# Patient Record
Sex: Female | Born: 1975 | Race: White | Hispanic: No | Marital: Married | Smoking: Never smoker
Health system: Southern US, Community
[De-identification: ages and names within clinical notes are randomized; demographics above are authoritative.]

## PROBLEM LIST (undated history)

## (undated) DIAGNOSIS — R112 Nausea with vomiting, unspecified: Secondary | ICD-10-CM

## (undated) DIAGNOSIS — Z8489 Family history of other specified conditions: Secondary | ICD-10-CM

## (undated) DIAGNOSIS — R209 Unspecified disturbances of skin sensation: Secondary | ICD-10-CM

## (undated) DIAGNOSIS — F329 Major depressive disorder, single episode, unspecified: Secondary | ICD-10-CM

## (undated) DIAGNOSIS — Z9889 Other specified postprocedural states: Secondary | ICD-10-CM

## (undated) DIAGNOSIS — T7840XA Allergy, unspecified, initial encounter: Secondary | ICD-10-CM

## (undated) DIAGNOSIS — R51 Headache: Secondary | ICD-10-CM

## (undated) DIAGNOSIS — D649 Anemia, unspecified: Secondary | ICD-10-CM

## (undated) DIAGNOSIS — F32A Depression, unspecified: Secondary | ICD-10-CM

## (undated) DIAGNOSIS — R519 Headache, unspecified: Secondary | ICD-10-CM

## (undated) DIAGNOSIS — B019 Varicella without complication: Secondary | ICD-10-CM

## (undated) HISTORY — PX: MOUTH SURGERY: SHX715

## (undated) HISTORY — DX: Varicella without complication: B01.9

## (undated) HISTORY — DX: Depression, unspecified: F32.A

## (undated) HISTORY — PX: WISDOM TOOTH EXTRACTION: SHX21

## (undated) HISTORY — PX: BREAST BIOPSY: SHX20

## (undated) HISTORY — DX: Major depressive disorder, single episode, unspecified: F32.9

## (undated) HISTORY — DX: Allergy, unspecified, initial encounter: T78.40XA

---

## 2003-01-03 HISTORY — PX: LASER ABLATION OF THE CERVIX: SHX1949

## 2009-08-10 ENCOUNTER — Encounter: Admission: RE | Admit: 2009-08-10 | Discharge: 2009-08-10 | Payer: Self-pay | Admitting: Obstetrics and Gynecology

## 2010-07-28 ENCOUNTER — Encounter: Payer: Self-pay | Admitting: Family Medicine

## 2010-07-28 ENCOUNTER — Ambulatory Visit (INDEPENDENT_AMBULATORY_CARE_PROVIDER_SITE_OTHER): Payer: BC Managed Care – PPO | Admitting: Family Medicine

## 2010-07-28 DIAGNOSIS — N879 Dysplasia of cervix uteri, unspecified: Secondary | ICD-10-CM | POA: Insufficient documentation

## 2010-07-28 DIAGNOSIS — Z0000 Encounter for general adult medical examination without abnormal findings: Secondary | ICD-10-CM

## 2010-07-28 DIAGNOSIS — F329 Major depressive disorder, single episode, unspecified: Secondary | ICD-10-CM

## 2010-07-28 DIAGNOSIS — IMO0001 Reserved for inherently not codable concepts without codable children: Secondary | ICD-10-CM | POA: Insufficient documentation

## 2010-07-28 DIAGNOSIS — F339 Major depressive disorder, recurrent, unspecified: Secondary | ICD-10-CM | POA: Insufficient documentation

## 2010-07-28 DIAGNOSIS — R635 Abnormal weight gain: Secondary | ICD-10-CM

## 2010-07-28 DIAGNOSIS — E785 Hyperlipidemia, unspecified: Secondary | ICD-10-CM

## 2010-07-28 LAB — LIPID PANEL
Cholesterol: 247 mg/dL — ABNORMAL HIGH (ref 0–200)
Triglycerides: 98 mg/dL (ref 0.0–149.0)
VLDL: 19.6 mg/dL (ref 0.0–40.0)

## 2010-07-28 LAB — LDL CHOLESTEROL, DIRECT: Direct LDL: 152.8 mg/dL

## 2010-07-28 MED ORDER — VENLAFAXINE HCL ER 150 MG PO CP24: 150.0000 mg | ORAL_CAPSULE | Freq: Every day | ORAL | Status: DC

## 2010-07-28 MED ORDER — VENLAFAXINE HCL ER 75 MG PO CP24: 75.0000 mg | ORAL_CAPSULE | Freq: Every day | ORAL | Status: DC

## 2010-07-28 MED ORDER — VENLAFAXINE HCL ER 37.5 MG PO CP24: 37.5000 mg | ORAL_CAPSULE | Freq: Every day | ORAL | Status: DC

## 2010-07-28 NOTE — Progress Notes (Signed)
  Subjective:    Patient ID: Felicia Ortega, female    DOB: 1976/01/01, 35 y.o.   MRN: [id number]  HPI New patient to establish care. 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 She's had multiple recurrences. She took herself off medication several months ago has had progressive depression symptoms since then. She's been on multiple agents previously including Prozac and Luvox but Effexor seemed to work best. She was at a dosage of 225 mg daily. No history of suicidal ideation. Frequent crying spells. Also has some anxiety symptoms frequently. She denied side effects with Effexor. She has allergy to sulfa medications. Weight gain of about 10 pounds during past year.  No edema.  Is exercising regularly but difficulty losing weight.  Prior history of abnormal Pap smear with laser treatment secondary to dysplasia 2005. She sees gynecologist regularly. History of dysplastic nevi. Follow closely by dermatology  Recent weight gain issues. Did start regular exercise recently but has had difficulty losing weight. Occasional fatigue issues.  Family history significant for father with depression and history of Hodgkin's disease and prostate cancer. Mother had melanoma and hyperlipidemia.  Patient is single. She has a Scientist, water quality in [employer].  nonsmoker. No alcohol use.   Review of Systems  Constitutional: Positive for fatigue and unexpected weight change (weight gain). Negative for fever, activity change and appetite change.  Eyes: Negative for visual disturbance.  Respiratory: Negative for cough and shortness of breath.   Cardiovascular: Negative for chest pain and leg swelling.  Gastrointestinal: Negative for abdominal pain.  Musculoskeletal: Negative for arthralgias.  Neurological: Negative for dizziness and headaches.  Psychiatric/Behavioral: Positive for dysphoric mood. Negative for suicidal ideas and confusion.       Objective:   Physical  Exam  Constitutional: She is oriented to person, place, and time. She appears well-developed and well-nourished. No distress.  Neck: Neck supple. No thyromegaly present.  Cardiovascular: Normal rate, regular rhythm and normal heart sounds.   No murmur heard. Pulmonary/Chest: Effort normal and breath sounds normal. No respiratory distress. She has no wheezes. She has no rales.  Lymphadenopathy:    She has no cervical adenopathy.  Neurological: She is alert and oriented to person, place, and time. No cranial nerve deficit.  Psychiatric: She has a normal mood and affect. Her behavior is normal.          Assessment & Plan:  #1 Recurrent depression. By history she's had at least 3 episodes which dictates recommendation for chronic antidepressant. Get back on Effexor and build up to 150 mg daily and reassess one month after reaching that dosage. Check TSH and patient request lipid panel with prior history reported hyperlipidemia.  Pt given names of some local counselors. #2 history of dysplastic nevi -continue close follow up with dermatology. # 3 history of cervical dysplasia #4 Weight gain.  Check TSH as above.

## 2010-07-29 NOTE — Progress Notes (Signed)
Quick Note:  Pt informed ______ 

## 2010-08-01 ENCOUNTER — Telehealth: Payer: Self-pay | Admitting: Family Medicine

## 2010-08-01 NOTE — Telephone Encounter (Signed)
Pt called and is req lab results, especially cholesterol. Pls call.

## 2010-08-01 NOTE — Telephone Encounter (Signed)
I called only phone number on our records, it was a female voice and it sounded like "you have reached the [place] residence".  I mailed the labs to pt home address with a note to confirm correct phone number for pt.

## 2010-08-01 NOTE — Telephone Encounter (Signed)
Due to network difficulties our call could not be sent at this time

## 2010-08-02 ENCOUNTER — Telehealth: Payer: Self-pay | Admitting: Family Medicine

## 2010-08-02 NOTE — Telephone Encounter (Signed)
Pt was in on 07/28/10 for lab work and would like to be contacted about the results of her cholesterol she needs the information for insurance forms.

## 2010-08-03 NOTE — Telephone Encounter (Signed)
Copy of pt labs mailed to her home, message left on her VM

## 2010-08-10 ENCOUNTER — Ambulatory Visit: Payer: Self-pay | Admitting: Internal Medicine

## 2010-09-28 ENCOUNTER — Ambulatory Visit: Payer: BC Managed Care – PPO | Admitting: Family Medicine

## 2010-10-05 ENCOUNTER — Ambulatory Visit (INDEPENDENT_AMBULATORY_CARE_PROVIDER_SITE_OTHER): Payer: BC Managed Care – PPO | Admitting: Family Medicine

## 2010-10-05 ENCOUNTER — Encounter: Payer: Self-pay | Admitting: Family Medicine

## 2010-10-05 VITALS — BP 110/80 | Temp 97.8°F | Wt 134.0 lb

## 2010-10-05 DIAGNOSIS — F329 Major depressive disorder, single episode, unspecified: Secondary | ICD-10-CM

## 2010-10-05 DIAGNOSIS — F339 Major depressive disorder, recurrent, unspecified: Secondary | ICD-10-CM

## 2010-10-05 MED ORDER — VENLAFAXINE HCL ER 150 MG PO CP24: 150.0000 mg | ORAL_CAPSULE | Freq: Every day | ORAL | Status: DC

## 2010-10-05 NOTE — Progress Notes (Signed)
  Subjective:    Patient ID: Felicia Ortega, female    DOB: 1975-06-05, 35 y.o.   MRN: [id number]  HPI Followup depression. Significantly improved. Effexor XR 150 mg daily. No side effects. Noted improvement in mood after couple of days. She had been binge eating and has been controlled with medication. She has lost about 7 pounds and is very happy with that. Overall has much improved focus. No crying spells.  Sleep OK.  Recent lab work normal TSH. Hyperlipidemia but excellent HDL. No family history of premature CAD.   Review of Systems  Constitutional: Negative for appetite change and fatigue.  Respiratory: Negative for shortness of breath.   Cardiovascular: Negative for chest pain.  Psychiatric/Behavioral: Negative for confusion, dysphoric mood and agitation. The patient is not nervous/anxious.        Objective:   Physical Exam  Constitutional: She is oriented to person, place, and time. She appears well-developed and well-nourished.  Cardiovascular: Normal rate, regular rhythm and normal heart sounds.   Pulmonary/Chest: Effort normal and breath sounds normal. No respiratory distress. She has no wheezes. She has no rales.  Musculoskeletal: She exhibits no edema.  Neurological: She is alert and oriented to person, place, and time. No cranial nerve deficit.  Psychiatric: She has a normal mood and affect. Her behavior is normal.          Assessment & Plan:  Recurrent depression. Greatly improved on Effexor 150 mg. Refill medication for one year. Given multiple recurrences we've recommended indefinite treatment. Flu vaccine offered and patient declines. Complete physical next summer.

## 2011-04-06 ENCOUNTER — Ambulatory Visit (INDEPENDENT_AMBULATORY_CARE_PROVIDER_SITE_OTHER): Payer: BC Managed Care – PPO | Admitting: Licensed Clinical Social Worker

## 2011-04-06 DIAGNOSIS — F331 Major depressive disorder, recurrent, moderate: Secondary | ICD-10-CM

## 2011-04-21 ENCOUNTER — Ambulatory Visit: Payer: BC Managed Care – PPO | Admitting: Licensed Clinical Social Worker

## 2011-05-01 ENCOUNTER — Ambulatory Visit (INDEPENDENT_AMBULATORY_CARE_PROVIDER_SITE_OTHER): Payer: BC Managed Care – PPO | Admitting: Licensed Clinical Social Worker

## 2011-05-01 DIAGNOSIS — F331 Major depressive disorder, recurrent, moderate: Secondary | ICD-10-CM

## 2011-05-15 ENCOUNTER — Ambulatory Visit: Payer: BC Managed Care – PPO | Admitting: Licensed Clinical Social Worker

## 2011-05-22 ENCOUNTER — Ambulatory Visit: Payer: BC Managed Care – PPO | Admitting: Licensed Clinical Social Worker

## 2011-07-28 LAB — HM PAP SMEAR: HM Pap smear: NORMAL

## 2011-09-28 ENCOUNTER — Ambulatory Visit (INDEPENDENT_AMBULATORY_CARE_PROVIDER_SITE_OTHER): Payer: BC Managed Care – PPO | Admitting: Family Medicine

## 2011-09-28 VITALS — BP 94/74 | HR 100 | Temp 98.1°F | Resp 12 | Ht 68.5 in | Wt 129.0 lb

## 2011-09-28 DIAGNOSIS — D239 Other benign neoplasm of skin, unspecified: Secondary | ICD-10-CM

## 2011-09-28 DIAGNOSIS — Z23 Encounter for immunization: Secondary | ICD-10-CM

## 2011-09-28 DIAGNOSIS — D229 Melanocytic nevi, unspecified: Secondary | ICD-10-CM | POA: Insufficient documentation

## 2011-09-28 DIAGNOSIS — Z0000 Encounter for general adult medical examination without abnormal findings: Secondary | ICD-10-CM

## 2011-09-28 LAB — HEPATIC FUNCTION PANEL
AST: 18 U/L (ref 0–37)
Albumin: 3.7 g/dL (ref 3.5–5.2)
Alkaline Phosphatase: 53 U/L (ref 39–117)
Bilirubin, Direct: 0 mg/dL (ref 0.0–0.3)

## 2011-09-28 LAB — CBC WITH DIFFERENTIAL/PLATELET
Basophils Absolute: 0.1 10*3/uL (ref 0.0–0.1)
HCT: 36 % (ref 36.0–46.0)
Lymphocytes Relative: 22.7 % (ref 12.0–46.0)
Lymphs Abs: 1.2 10*3/uL (ref 0.7–4.0)
MCHC: 32.2 g/dL (ref 30.0–36.0)
Monocytes Absolute: 0.5 10*3/uL (ref 0.1–1.0)
Neutro Abs: 3.3 10*3/uL (ref 1.4–7.7)
Neutrophils Relative %: 62.8 % (ref 43.0–77.0)
Platelets: 596 10*3/uL — ABNORMAL HIGH (ref 150.0–400.0)
RBC: 4.11 Mil/uL (ref 3.87–5.11)
RDW: 15.7 % — ABNORMAL HIGH (ref 11.5–14.6)

## 2011-09-28 LAB — TSH: TSH: 2.58 u[IU]/mL (ref 0.35–5.50)

## 2011-09-28 LAB — LDL CHOLESTEROL, DIRECT: Direct LDL: 208.6 mg/dL

## 2011-09-28 LAB — BASIC METABOLIC PANEL: Potassium: 4.3 mEq/L (ref 3.5–5.1)

## 2011-09-28 LAB — LIPID PANEL: Total CHOL/HDL Ratio: 4

## 2011-09-28 MED ORDER — TETANUS-DIPHTH-ACELL PERTUSSIS 5-2.5-18.5 LF-MCG/0.5 IM SUSP: 0.5000 mL | Freq: Once | INTRAMUSCULAR | Status: DC

## 2011-09-28 NOTE — Progress Notes (Signed)
  Subjective:    Patient ID: Felicia Ortega, female    DOB: 08/19/1975, 36 y.o.   MRN: [id number]  HPI  Patient here for wellness visit. She sees gynecologist regularly. History of cervical dysplasia. She also has history of recurrent depression currently stable on Effexor 150 mg daily. She has had some counseling in the past. She's been on antidepressants for over 10 years. Last tetanus unknown. Has not yet received flu vaccine. She has appointment scheduled with gynecologist for Pap smears.  Nonsmoker. No consistent exercise. Family history significant for mother with melanoma history. Father had prostate cancer.  Past Medical History  Diagnosis Date  . Chicken pox   . Depression   . Allergy    Past Surgical History  Procedure Date  . Laser ablation of the cervix 2005    dysplasia    reports that she has never smoked. She does not have any smokeless tobacco history on file. Her alcohol and drug histories not on file. family history includes Alcohol abuse in her maternal grandfather and paternal grandfather; Cancer in her father, maternal aunt, and maternal grandmother; Hodgkin's lymphoma in her father; Hyperlipidemia in her father, maternal grandfather, maternal grandmother, mother, paternal grandfather, and paternal grandmother; Melanoma in her maternal aunt; and Mental illness in her father. Allergies  Allergen Reactions  . Sulfa Antibiotics     hives     Review of Systems  Constitutional: Negative for fever, activity change, appetite change, fatigue and unexpected weight change.  HENT: Negative for hearing loss, ear pain, sore throat and trouble swallowing.   Eyes: Negative for visual disturbance.  Respiratory: Negative for cough and shortness of breath.   Cardiovascular: Negative for chest pain and palpitations.  Gastrointestinal: Negative for abdominal pain, diarrhea, constipation and blood in stool.  Genitourinary: Negative for dysuria and hematuria.  Musculoskeletal:  Negative for myalgias, back pain and arthralgias.  Skin: Negative for rash.  Neurological: Negative for dizziness, syncope and headaches.  Hematological: Negative for adenopathy.  Psychiatric/Behavioral: Negative for confusion and dysphoric mood.       Objective:   Physical Exam  Constitutional: She is oriented to person, place, and time. She appears well-developed and well-nourished.  HENT:  Head: Normocephalic and atraumatic.  Eyes: EOM are normal. Pupils are equal, round, and reactive to light.  Neck: Normal range of motion. Neck supple. No thyromegaly present.  Cardiovascular: Normal rate, regular rhythm and normal heart sounds.   No murmur heard. Pulmonary/Chest: Breath sounds normal. No respiratory distress. She has no wheezes. She has no rales.  Abdominal: Soft. Bowel sounds are normal. She exhibits no distension and no mass. There is no tenderness. There is no rebound and no guarding.  Musculoskeletal: Normal range of motion. She exhibits no edema.  Lymphadenopathy:    She has no cervical adenopathy.  Neurological: She is alert and oriented to person, place, and time. She displays normal reflexes. No cranial nerve deficit.  Skin: No rash noted.       Multiple nevi.  Some have slightly atypical features  She has follow up with dermatologist in couple of weeks for skin exam.  Psychiatric: She has a normal mood and affect. Her behavior is normal. Judgment and thought content normal.          Assessment & Plan:  Health maintenance. Flu vaccine given. Tdap given. Continue regular GYN followup. Baseline labs. Continue regular skin checks every 6 months

## 2011-09-28 NOTE — Patient Instructions (Addendum)
Continue regular GYN followup. Establish consistent weightbearing aerobic exercise Tetanus shot is good for 10 years

## 2011-09-29 ENCOUNTER — Other Ambulatory Visit: Payer: Self-pay | Admitting: *Deleted

## 2011-09-29 DIAGNOSIS — E785 Hyperlipidemia, unspecified: Secondary | ICD-10-CM

## 2011-09-29 NOTE — Progress Notes (Signed)
Quick Note:  Pt informed labs will be sent to her home with instructions highlighted ______

## 2011-10-31 ENCOUNTER — Other Ambulatory Visit: Payer: Self-pay | Admitting: Family Medicine

## 2012-07-30 ENCOUNTER — Other Ambulatory Visit: Payer: Self-pay | Admitting: Family Medicine

## 2012-10-08 ENCOUNTER — Other Ambulatory Visit: Payer: Self-pay | Admitting: Family Medicine

## 2012-11-11 ENCOUNTER — Encounter: Payer: Self-pay | Admitting: Family Medicine

## 2012-11-11 ENCOUNTER — Ambulatory Visit (INDEPENDENT_AMBULATORY_CARE_PROVIDER_SITE_OTHER): Payer: BC Managed Care – PPO | Admitting: Family Medicine

## 2012-11-11 VITALS — BP 122/74 | HR 79 | Temp 98.0°F | Ht 68.5 in | Wt 129.0 lb

## 2012-11-11 DIAGNOSIS — Z23 Encounter for immunization: Secondary | ICD-10-CM

## 2012-11-11 DIAGNOSIS — Z0000 Encounter for general adult medical examination without abnormal findings: Secondary | ICD-10-CM

## 2012-11-11 LAB — BASIC METABOLIC PANEL
BUN: 13 mg/dL (ref 6–23)
Calcium: 9.4 mg/dL (ref 8.4–10.5)
Chloride: 104 mEq/L (ref 96–112)
Creatinine, Ser: 0.9 mg/dL (ref 0.4–1.2)
GFR: 77.91 mL/min (ref >60.00)
Glucose, Bld: 85 mg/dL (ref 70–99)
Sodium: 139 mEq/L (ref 135–145)

## 2012-11-11 LAB — CBC WITH DIFFERENTIAL/PLATELET
Eosinophils Absolute: 0.1 10*3/uL (ref 0.0–0.7)
Lymphocytes Relative: 30.1 % (ref 12.0–46.0)
Lymphs Abs: 1.5 10*3/uL (ref 0.7–4.0)
MCHC: 31.3 g/dL (ref 30.0–36.0)
Monocytes Relative: 10.9 % (ref 3.0–12.0)
RBC: 4.2 Mil/uL (ref 3.87–5.11)
RDW: 17.8 % — ABNORMAL HIGH (ref 11.5–14.6)

## 2012-11-11 LAB — TSH: TSH: 2.06 u[IU]/mL (ref 0.35–5.50)

## 2012-11-11 LAB — LIPID PANEL
Cholesterol: 215 mg/dL — ABNORMAL HIGH (ref 0–200)
Triglycerides: 52 mg/dL (ref 0.0–149.0)

## 2012-11-11 LAB — HEPATIC FUNCTION PANEL
Bilirubin, Direct: 0 mg/dL (ref 0.0–0.3)
Total Protein: 6.8 g/dL (ref 6.0–8.3)

## 2012-11-11 MED ORDER — VENLAFAXINE HCL ER 150 MG PO CP24: 150.0000 mg | ORAL_CAPSULE | Freq: Every day | ORAL | Status: DC

## 2012-11-11 NOTE — Progress Notes (Signed)
  Subjective:    Patient ID: Felicia Ortega, female    DOB: Sep 23, 1975, 37 y.o.   MRN: [id number]  HPI Patient seen for complete physical. She continues to see gynecologist and dermatologist regularly. History of dysplastic nevi. Positive family history of melanoma in mother. Depression which is treated with Effexor XR. Requesting refills. Depression is stable. Needs flu vaccine.  History of severe hyperlipidemia. Otherwise low risk for CAD or peripheral vascular disease. She has made some dietary changes since last year. No history of smoking. She walks for exercise.  Past Medical History  Diagnosis Date  . Chicken pox   . Depression   . Allergy    Past Surgical History  Procedure Laterality Date  . Laser ablation of the cervix  2005    dysplasia    reports that she has never smoked. She does not have any smokeless tobacco history on file. Her alcohol and drug histories are not on file. family history includes Alcohol abuse in her maternal grandfather and paternal grandfather; Cancer in her father, maternal aunt, and maternal grandmother; Hodgkin's lymphoma in her father; Hyperlipidemia in her father, maternal grandfather, maternal grandmother, mother, paternal grandfather, and paternal grandmother; Melanoma in her maternal aunt; Mental illness in her father. Allergies  Allergen Reactions  . Sulfa Antibiotics     hives      Review of Systems  Constitutional: Negative for fever, activity change, appetite change, fatigue and unexpected weight change.  HENT: Negative for ear pain, hearing loss, sore throat and trouble swallowing.   Eyes: Negative for visual disturbance.  Respiratory: Negative for cough and shortness of breath.   Cardiovascular: Negative for chest pain and palpitations.  Gastrointestinal: Negative for abdominal pain, diarrhea, constipation and blood in stool.  Genitourinary: Negative for dysuria and hematuria.  Musculoskeletal: Negative for arthralgias, back  pain and myalgias.  Skin: Negative for rash.  Neurological: Negative for dizziness, syncope and headaches.  Hematological: Negative for adenopathy.  Psychiatric/Behavioral: Negative for confusion and dysphoric mood.       Objective:   Physical Exam  Constitutional: She is oriented to person, place, and time. She appears well-developed and well-nourished.  HENT:  Head: Normocephalic and atraumatic.  Eyes: EOM are normal. Pupils are equal, round, and reactive to light.  Neck: Normal range of motion. Neck supple. No thyromegaly present.  Cardiovascular: Normal rate, regular rhythm and normal heart sounds.   No murmur heard. Pulmonary/Chest: Breath sounds normal. No respiratory distress. She has no wheezes. She has no rales.  Abdominal: Soft. Bowel sounds are normal. She exhibits no distension and no mass. There is no tenderness. There is no rebound and no guarding.  Genitourinary:  Per GYN  Musculoskeletal: Normal range of motion. She exhibits no edema.  Lymphadenopathy:    She has no cervical adenopathy.  Neurological: She is alert and oriented to person, place, and time. She displays normal reflexes. No cranial nerve deficit.  Skin: No rash noted.  Psychiatric: She has a normal mood and affect. Her behavior is normal. Judgment and thought content normal.          Assessment & Plan:  Complete physical. Obtain screening lab work. Refill Effexor for one year. Flu vaccine given. We discussed the importance of weight-bearing exercise and adequate calcium and vitamin D.

## 2012-11-11 NOTE — Addendum Note (Signed)
Addended by: Shelby Dubin E on: 11/11/2012 11:25 AM   Modules accepted: Orders

## 2012-11-11 NOTE — Progress Notes (Signed)
Pre visit review using our clinic review tool, if applicable. No additional management support is needed unless otherwise documented below in the visit note. 

## 2012-11-15 ENCOUNTER — Other Ambulatory Visit: Payer: Self-pay | Admitting: Family Medicine

## 2012-11-15 DIAGNOSIS — D582 Other hemoglobinopathies: Secondary | ICD-10-CM

## 2012-12-25 ENCOUNTER — Emergency Department (HOSPITAL_BASED_OUTPATIENT_CLINIC_OR_DEPARTMENT_OTHER)
Admission: EM | Admit: 2012-12-25 | Discharge: 2012-12-25 | Disposition: A | Discharge status: Home / Self Care | Payer: Worker's Compensation | Attending: Emergency Medicine | Admitting: Emergency Medicine

## 2012-12-25 ENCOUNTER — Encounter (HOSPITAL_BASED_OUTPATIENT_CLINIC_OR_DEPARTMENT_OTHER): Payer: Self-pay | Admitting: Emergency Medicine

## 2012-12-25 DIAGNOSIS — S0101XA Laceration without foreign body of scalp, initial encounter: Secondary | ICD-10-CM

## 2012-12-25 DIAGNOSIS — Z8619 Personal history of other infectious and parasitic diseases: Secondary | ICD-10-CM | POA: Insufficient documentation

## 2012-12-25 DIAGNOSIS — Z79899 Other long term (current) drug therapy: Secondary | ICD-10-CM | POA: Insufficient documentation

## 2012-12-25 DIAGNOSIS — S0100XA Unspecified open wound of scalp, initial encounter: Secondary | ICD-10-CM | POA: Insufficient documentation

## 2012-12-25 DIAGNOSIS — Y9289 Other specified places as the place of occurrence of the external cause: Secondary | ICD-10-CM | POA: Insufficient documentation

## 2012-12-25 DIAGNOSIS — Y990 Civilian activity done for income or pay: Secondary | ICD-10-CM | POA: Insufficient documentation

## 2012-12-25 DIAGNOSIS — W2209XA Striking against other stationary object, initial encounter: Secondary | ICD-10-CM | POA: Insufficient documentation

## 2012-12-25 DIAGNOSIS — F329 Major depressive disorder, single episode, unspecified: Secondary | ICD-10-CM | POA: Insufficient documentation

## 2012-12-25 DIAGNOSIS — F3289 Other specified depressive episodes: Secondary | ICD-10-CM | POA: Insufficient documentation

## 2012-12-25 NOTE — ED Notes (Signed)
Patient hit top of head on concrete beam while at work. Very small superficial laceration noted, no loc, no nausea

## 2012-12-25 NOTE — ED Provider Notes (Signed)
CSN: [id number]     Arrival date & time 12/25/12  1207 History   First MD Initiated Contact with Patient 12/25/12 1337     Chief Complaint  Patient presents with  . Head Injury   (Consider location/radiation/quality/duration/timing/severity/associated sxs/prior Treatment) Patient is a 37 y.o. female presenting with head injury. The history is provided by the patient. No language interpreter was used.  Head Injury Location:  Generalized Mechanism of injury: unable to specify   Pain details:    Quality:  Aching   Severity:  No pain   Timing:  Constant   Progression:  Unable to specify Chronicity:  New Relieved by:  Nothing Worsened by:  Nothing tried Ineffective treatments:  None tried Associated symptoms: no headaches and no loss of consciousness   Pt complains of laceration to scalp.   Past Medical History  Diagnosis Date  . Chicken pox   . Depression   . Allergy    Past Surgical History  Procedure Laterality Date  . Laser ablation of the cervix  2005    dysplasia   Family History  Problem Relation Age of Onset  . Hyperlipidemia Mother   . Cancer Father     prostate  . Mental illness Father     depression, anxiety  . Hyperlipidemia Father   . Hodgkin's lymphoma Father   . Cancer Maternal Aunt     breast  . Melanoma Maternal Aunt   . Cancer Maternal Grandmother     lung  . Hyperlipidemia Maternal Grandmother   . Alcohol abuse Maternal Grandfather   . Hyperlipidemia Maternal Grandfather   . Hyperlipidemia Paternal Grandmother   . Alcohol abuse Paternal Grandfather   . Hyperlipidemia Paternal Grandfather    History  Substance Use Topics  . Smoking status: Never Smoker   . Smokeless tobacco: Not on file  . Alcohol Use: Not on file   OB History   Grav Para Term Preterm Abortions TAB SAB Ect Mult Living                 Review of Systems  Skin: Positive for wound.  Neurological: Negative for loss of consciousness and headaches.  All other systems  reviewed and are negative.    Allergies  Sulfa antibiotics  Home Medications   Current Outpatient Rx  Name  Route  Sig  Dispense  Refill  . venlafaxine XR (EFFEXOR-XR) 150 MG 24 hr capsule   Oral   Take 1 capsule (150 mg total) by mouth daily with breakfast.   90 capsule   3    BP 120/65  Pulse 69  Temp(Src) 98.2 F (36.8 C) (Oral)  Resp 18  SpO2 100% Physical Exam  Nursing note and vitals reviewed. Constitutional: She appears well-developed and well-nourished.  HENT:  Head: Normocephalic and atraumatic.  1cm superficial abrasion scalp  Eyes: Pupils are equal, round, and reactive to light.  Neck: Normal range of motion.  Pulmonary/Chest: Effort normal and breath sounds normal.  Musculoskeletal: Normal range of motion.  Skin: Skin is warm.  Psychiatric: She has a normal mood and affect.    ED Course  Procedures (including critical care time) Labs Review Labs Reviewed - No data to display Imaging Review No results found.  EKG Interpretation   None       MDM   1. Laceration of scalp, initial encounter    Wound care  And tylenol    Elson Areas, PA-C 12/25/12 1409

## 2012-12-25 NOTE — ED Provider Notes (Signed)
Medical screening examination/treatment/procedure(s) were performed by non-physician practitioner and as supervising physician I was immediately available for consultation/collaboration.  EKG Interpretation   None        Renad Jenniges, MD 12/25/12 1538 

## 2013-06-01 ENCOUNTER — Encounter (HOSPITAL_BASED_OUTPATIENT_CLINIC_OR_DEPARTMENT_OTHER): Payer: Self-pay | Admitting: Emergency Medicine

## 2013-06-01 ENCOUNTER — Emergency Department (HOSPITAL_BASED_OUTPATIENT_CLINIC_OR_DEPARTMENT_OTHER)
Admission: EM | Admit: 2013-06-01 | Discharge: 2013-06-01 | Disposition: A | Discharge status: Home / Self Care | Payer: BC Managed Care – PPO | Attending: Emergency Medicine | Admitting: Emergency Medicine

## 2013-06-01 DIAGNOSIS — Z8619 Personal history of other infectious and parasitic diseases: Secondary | ICD-10-CM | POA: Insufficient documentation

## 2013-06-01 DIAGNOSIS — L259 Unspecified contact dermatitis, unspecified cause: Secondary | ICD-10-CM | POA: Insufficient documentation

## 2013-06-01 DIAGNOSIS — Z79899 Other long term (current) drug therapy: Secondary | ICD-10-CM | POA: Insufficient documentation

## 2013-06-01 DIAGNOSIS — F3289 Other specified depressive episodes: Secondary | ICD-10-CM | POA: Insufficient documentation

## 2013-06-01 DIAGNOSIS — F329 Major depressive disorder, single episode, unspecified: Secondary | ICD-10-CM | POA: Insufficient documentation

## 2013-06-01 MED ORDER — PREDNISONE 10 MG PO TABS: ORAL_TABLET | ORAL | Status: DC

## 2013-06-01 NOTE — ED Provider Notes (Signed)
Medical screening examination/treatment/procedure(s) were conducted as a shared visit with non-physician practitioner(s) and myself.  I personally evaluated the patient during the encounter.  Nonspecific rash that is itchy x1 week.  Denies new exposures. No oral or mucus membrane involvemtn Possible bug bites. Distribution not consistent with scabies.  EKG Interpretation None        Ezequiel Essex, MD 06/01/13 2318

## 2013-06-01 NOTE — Discharge Instructions (Signed)

## 2013-06-01 NOTE — ED Notes (Signed)
Has had a rash for appx a week, spreading. Arms, legs and abd. States that it is itching and denies any new products

## 2013-06-01 NOTE — ED Notes (Signed)
PA at bedside.

## 2013-06-01 NOTE — ED Notes (Signed)
MD at bedside. 

## 2013-06-01 NOTE — ED Provider Notes (Signed)
CSN: [id number]     Arrival date & time 06/01/13  1830 History   First MD Initiated Contact with Patient 06/01/13 1834     Chief Complaint  Patient presents with  . Rash     (Consider location/radiation/quality/duration/timing/severity/associated sxs/prior Treatment) Patient is a 38 y.o. female presenting with rash. The history is provided by the patient. No language interpreter was used.  Rash Location:  Full body Quality: redness   Severity:  Moderate Timing:  Constant Progression:  Worsening Chronicity:  New Relieved by:  Nothing   Past Medical History  Diagnosis Date  . Chicken pox   . Depression   . Allergy    Past Surgical History  Procedure Laterality Date  . Laser ablation of the cervix  2005    dysplasia   Family History  Problem Relation Age of Onset  . Hyperlipidemia Mother   . Cancer Father     prostate  . Mental illness Father     depression, anxiety  . Hyperlipidemia Father   . Hodgkin's lymphoma Father   . Cancer Maternal Aunt     breast  . Melanoma Maternal Aunt   . Cancer Maternal Grandmother     lung  . Hyperlipidemia Maternal Grandmother   . Alcohol abuse Maternal Grandfather   . Hyperlipidemia Maternal Grandfather   . Hyperlipidemia Paternal Grandmother   . Alcohol abuse Paternal Grandfather   . Hyperlipidemia Paternal Grandfather    History  Substance Use Topics  . Smoking status: Never Smoker   . Smokeless tobacco: Not on file  . Alcohol Use: Yes     Comment: ocassional   OB History   Grav Para Term Preterm Abortions TAB SAB Ect Mult Living                 Review of Systems  Skin: Positive for rash.  All other systems reviewed and are negative.     Allergies  Sulfa antibiotics  Home Medications   Prior to Admission medications   Medication Sig Start Date End Date Taking? Authorizing Provider  predniSONE (DELTASONE) 10 MG tablet 6,5,4,3,2,1 taper 06/01/13   Fransico Meadow, PA-C  venlafaxine XR (EFFEXOR-XR) 150 MG 24  hr capsule Take 1 capsule (150 mg total) by mouth daily with breakfast. 11/11/12   Eulas Post, MD   BP 109/90  Pulse 94  Temp(Src) 98.7 F (37.1 C) (Oral)  Resp 18  Ht 5\' 8"  (1.727 m)  Wt 130 lb (58.968 kg)  BMI 19.77 kg/m2  SpO2 100%  LMP 05/11/2013 Physical Exam  Nursing note and vitals reviewed. Constitutional: She is oriented to person, place, and time. She appears well-developed and well-nourished.  HENT:  Head: Normocephalic.  Eyes: EOM are normal.  Neck: Normal range of motion.  Cardiovascular: Normal rate.   Pulmonary/Chest: Effort normal.  Abdominal: She exhibits no distension.  Musculoskeletal: Normal range of motion.  Neurological: She is alert and oriented to person, place, and time.  Skin: Rash noted.  Erythema multiple small raised areas  Psychiatric: She has a normal mood and affect.    ED Course  Procedures (including critical care time) Labs Review Labs Reviewed - No data to display  Imaging Review No results found.   EKG Interpretation None      MDM   Final diagnoses:  Contact dermatitis    Prednisone taper 6 day   Fransico Meadow, PA-C 06/01/13 1920

## 2013-11-05 ENCOUNTER — Other Ambulatory Visit: Payer: Self-pay | Admitting: Family Medicine

## 2013-11-17 ENCOUNTER — Telehealth: Payer: Self-pay | Admitting: Family Medicine

## 2013-11-17 MED ORDER — VENLAFAXINE HCL ER 150 MG PO CP24: 150.0000 mg | ORAL_CAPSULE | Freq: Every day | ORAL | Status: DC

## 2013-11-17 NOTE — Telephone Encounter (Signed)
Pt has appt in dec for cpx. Pt needs refill on generic effexor 150 mg #90 w/refills call into [facility] [address]

## 2013-11-17 NOTE — Telephone Encounter (Signed)
Rx sent to pharmacy   

## 2013-12-17 ENCOUNTER — Encounter: Payer: Self-pay | Admitting: Family Medicine

## 2014-01-08 ENCOUNTER — Encounter: Payer: Self-pay | Admitting: Family Medicine

## 2014-01-16 ENCOUNTER — Ambulatory Visit (INDEPENDENT_AMBULATORY_CARE_PROVIDER_SITE_OTHER): Payer: BLUE CROSS/BLUE SHIELD | Admitting: Family Medicine

## 2014-01-16 ENCOUNTER — Encounter: Payer: Self-pay | Admitting: Family Medicine

## 2014-01-16 VITALS — BP 124/74 | HR 80 | Temp 97.7°F | Wt 128.0 lb

## 2014-01-16 DIAGNOSIS — D509 Iron deficiency anemia, unspecified: Secondary | ICD-10-CM

## 2014-01-16 DIAGNOSIS — Z0000 Encounter for general adult medical examination without abnormal findings: Secondary | ICD-10-CM

## 2014-01-16 LAB — CBC WITH DIFFERENTIAL/PLATELET
BASOS PCT: 1.7 % (ref 0.0–3.0)
Basophils Absolute: 0.1 10*3/uL (ref 0.0–0.1)
EOS PCT: 1.8 % (ref 0.0–5.0)
Eosinophils Absolute: 0.1 10*3/uL (ref 0.0–0.7)
HCT: 31.4 % — ABNORMAL LOW (ref 36.0–46.0)
Hemoglobin: 9.6 g/dL — ABNORMAL LOW (ref 12.0–15.0)
LYMPHS PCT: 24.1 % (ref 12.0–46.0)
Lymphs Abs: 1.3 10*3/uL (ref 0.7–4.0)
MCHC: 30.6 g/dL (ref 30.0–36.0)
MCV: 74.9 fl — AB (ref 78.0–100.0)
Monocytes Absolute: 0.4 10*3/uL (ref 0.1–1.0)
Monocytes Relative: 6.9 % (ref 3.0–12.0)
Neutro Abs: 3.6 10*3/uL (ref 1.4–7.7)
Neutrophils Relative %: 65.5 % (ref 43.0–77.0)
Platelets: 612 10*3/uL — ABNORMAL HIGH (ref 150.0–400.0)
RBC: 4.18 Mil/uL (ref 3.87–5.11)
RDW: 20.2 % — ABNORMAL HIGH (ref 11.5–15.5)
WBC: 5.5 10*3/uL (ref 4.0–10.5)

## 2014-01-16 LAB — BASIC METABOLIC PANEL
BUN: 20 mg/dL (ref 6–23)
CHLORIDE: 107 meq/L (ref 96–112)
CO2: 24 mEq/L (ref 19–32)
CREATININE: 1.03 mg/dL (ref 0.40–1.20)
Calcium: 9.7 mg/dL (ref 8.4–10.5)
GFR: 62.89 mL/min (ref >60.00)
Glucose, Bld: 79 mg/dL (ref 70–99)
POTASSIUM: 5.1 meq/L (ref 3.5–5.1)
SODIUM: 139 meq/L (ref 135–145)

## 2014-01-16 LAB — LIPID PANEL
Cholesterol: 242 mg/dL — ABNORMAL HIGH (ref 0–200)
HDL: 82 mg/dL (ref >39.00)
LDL Cholesterol: 146 mg/dL — ABNORMAL HIGH (ref 0–99)
NonHDL: 160
TRIGLYCERIDES: 69 mg/dL (ref 0.0–149.0)
Total CHOL/HDL Ratio: 3
VLDL: 13.8 mg/dL (ref 0.0–40.0)

## 2014-01-16 LAB — HEPATIC FUNCTION PANEL
ALK PHOS: 44 U/L (ref 39–117)
ALT: 9 U/L (ref 0–35)
AST: 17 U/L (ref 0–37)
Albumin: 4.1 g/dL (ref 3.5–5.2)
BILIRUBIN DIRECT: 0.1 mg/dL (ref 0.0–0.3)
TOTAL PROTEIN: 7.4 g/dL (ref 6.0–8.3)
Total Bilirubin: 0.3 mg/dL (ref 0.2–1.2)

## 2014-01-16 LAB — TSH: TSH: 2.5 u[IU]/mL (ref 0.35–4.50)

## 2014-01-16 LAB — FERRITIN: FERRITIN: 1.2 ng/mL — AB (ref 10.0–291.0)

## 2014-01-16 MED ORDER — VENLAFAXINE HCL ER 150 MG PO CP24: 150.0000 mg | ORAL_CAPSULE | Freq: Every day | ORAL | Status: DC

## 2014-01-16 NOTE — Progress Notes (Signed)
   Subjective:    Patient ID: Felicia Ortega, female    DOB: April 11, 1975, 39 y.o.   MRN: [id number]  HPI Patient seen for complete physical. She sees gynecologist and dermatologist regularly. She has a history of recurrent depression which is currently stable on Effexor 150 mg once daily. She had iron deficiency last year. She has heavy menses. She has discussed this with her gynecologist. They had her on iron supplement but she recently ran out. She does have some fatigue issues occasionally but no dizziness. Exercises somewhat sporadically. Tetanus up-to-date. Had flu vaccine. History of hyperlipidemia but low risk for CAD overall  Reviewed with no changes  Past Medical History  Diagnosis Date  . Chicken pox   . Depression   . Allergy    Past Surgical History  Procedure Laterality Date  . Laser ablation of the cervix  2005    dysplasia    reports that she has never smoked. She does not have any smokeless tobacco history on file. She reports that she drinks alcohol. She reports that she does not use illicit drugs. family history includes Alcohol abuse in her maternal grandfather and paternal grandfather; Cancer in her father, maternal aunt, and maternal grandmother; Hodgkin's lymphoma in her father; Hyperlipidemia in her father, maternal grandfather, maternal grandmother, mother, paternal grandfather, and paternal grandmother; Melanoma in her maternal aunt; Mental illness in her father. Allergies  Allergen Reactions  . Sulfa Antibiotics     hives      Review of Systems  Constitutional: Negative for fever, activity change, appetite change, fatigue and unexpected weight change.  HENT: Negative for ear pain, hearing loss, sore throat and trouble swallowing.   Eyes: Negative for visual disturbance.  Respiratory: Negative for cough and shortness of breath.   Cardiovascular: Negative for chest pain and palpitations.  Gastrointestinal: Negative for abdominal pain, diarrhea,  constipation and blood in stool.  Genitourinary: Negative for dysuria and hematuria.  Musculoskeletal: Negative for myalgias, back pain and arthralgias.  Skin: Negative for rash.  Neurological: Negative for dizziness, syncope and headaches.  Hematological: Negative for adenopathy.  Psychiatric/Behavioral: Negative for confusion and dysphoric mood.       Objective:   Physical Exam  Constitutional: She is oriented to person, place, and time. She appears well-developed and well-nourished.  HENT:  Head: Normocephalic and atraumatic.  Eyes: EOM are normal. Pupils are equal, round, and reactive to light.  Neck: Normal range of motion. Neck supple. No thyromegaly present.  Cardiovascular: Normal rate, regular rhythm and normal heart sounds.   No murmur heard. Pulmonary/Chest: Breath sounds normal. No respiratory distress. She has no wheezes. She has no rales.  Abdominal: Soft. Bowel sounds are normal. She exhibits no distension and no mass. There is no tenderness. There is no rebound and no guarding.  Genitourinary:  Per GYN  Musculoskeletal: Normal range of motion. She exhibits no edema.  Lymphadenopathy:    She has no cervical adenopathy.  Neurological: She is alert and oriented to person, place, and time. She displays normal reflexes. No cranial nerve deficit.  Skin: No rash noted.  Psychiatric: She has a normal mood and affect. Her behavior is normal. Judgment and thought content normal.          Assessment & Plan:  Complete physical. Obtain labs. Include ferritin. Continue iron supplementation. Handout on iron rich diet given. Refill Effexor for one year

## 2014-01-16 NOTE — Progress Notes (Signed)
Pre visit review using our clinic review tool, if applicable. No additional management support is needed unless otherwise documented below in the visit note. 

## 2014-01-16 NOTE — Patient Instructions (Signed)
Iron-Rich Diet An iron-rich diet contains foods that are good sources of iron. Iron is an important mineral that helps your body produce hemoglobin. Hemoglobin is a protein in red blood cells that carries oxygen to the body's tissues. Sometimes, the iron level in your blood can be low. This may be caused by:  A lack of iron in your diet.  Blood loss.  Times of growth, such as during pregnancy or during a child's growth and development. Low levels of iron can cause a decrease in the number of red blood cells. This can result in iron deficiency anemia. Iron deficiency anemia symptoms include:  Tiredness.  Weakness.  Irritability.  Increased chance of infection. Here are some recommendations for daily iron intake:  Males older than 41 years of age need 8 mg of iron per day.  Women ages 19 to 50 need 18 mg of iron per day.  Pregnant women need 27 mg of iron per day, and women who are over 19 years of age and breastfeeding need 9 mg of iron per day.  Women over the age of 50 need 8 mg of iron per day. SOURCES OF IRON There are 2 types of iron that are found in food: heme iron and nonheme iron. Heme iron is absorbed by the body better than nonheme iron. Heme iron is found in meat, poultry, and fish. Nonheme iron is found in grains, beans, and vegetables. Heme Iron Sources Food / Iron (mg)  Chicken liver, 3 oz (85 g)/ 10 mg  Beef liver, 3 oz (85 g)/ 5.5 mg  Oysters, 3 oz (85 g)/ 8 mg  Beef, 3 oz (85 g)/ 2 to 3 mg  Shrimp, 3 oz (85 g)/ 2.8 mg  Turkey, 3 oz (85 g)/ 2 mg  Chicken, 3 oz (85 g) / 1 mg  Fish (tuna, halibut), 3 oz (85 g)/ 1 mg  Pork, 3 oz (85 g)/ 0.9 mg Nonheme Iron Sources Food / Iron (mg)  Ready-to-eat breakfast cereal, iron-fortified / 3.9 to 7 mg  Tofu,  cup / 3.4 mg  Kidney beans,  cup / 2.6 mg  Baked potato with skin / 2.7 mg  Asparagus,  cup / 2.2 mg  Avocado / 2 mg  Dried peaches,  cup / 1.6 mg  Raisins,  cup / 1.5 mg  Soy milk, 1 cup  / 1.5 mg  Whole-wheat bread, 1 slice / 1.2 mg  Spinach, 1 cup / 0.8 mg  Broccoli,  cup / 0.6 mg IRON ABSORPTION Certain foods can decrease the body's absorption of iron. Try to avoid these foods and beverages while eating meals with iron-containing foods:  Coffee.  Tea.  Fiber.  Soy. Foods containing vitamin C can help increase the amount of iron your body absorbs from iron sources, especially from nonheme sources. Eat foods with vitamin C along with iron-containing foods to increase your iron absorption. Foods that are high in vitamin C include many fruits and vegetables. Some good sources are:  Fresh orange juice.  Oranges.  Strawberries.  Mangoes.  Grapefruit.  Red bell peppers.  Green bell peppers.  Broccoli.  Potatoes with skin.  Tomato juice. Document Released: 08/02/2004 Document Revised: 03/13/2011 Document Reviewed: 06/09/2010 [employer] Patient Information 2015 [employer]. This information is not intended to replace advice given to you by your health care provider. Make sure you discuss any questions you have with your health care provider.  

## 2014-01-21 ENCOUNTER — Other Ambulatory Visit: Payer: Self-pay | Admitting: Family Medicine

## 2014-01-21 ENCOUNTER — Telehealth: Payer: Self-pay | Admitting: Family Medicine

## 2014-01-21 DIAGNOSIS — D649 Anemia, unspecified: Secondary | ICD-10-CM

## 2014-01-21 NOTE — Telephone Encounter (Signed)
Pt informed

## 2014-01-21 NOTE — Telephone Encounter (Signed)
Pt would results of labs/ returning your call. pls call

## 2014-01-30 ENCOUNTER — Other Ambulatory Visit (INDEPENDENT_AMBULATORY_CARE_PROVIDER_SITE_OTHER): Payer: BLUE CROSS/BLUE SHIELD

## 2014-01-30 DIAGNOSIS — R195 Other fecal abnormalities: Secondary | ICD-10-CM

## 2014-01-30 LAB — POC HEMOCCULT BLD/STL (HOME/3-CARD/SCREEN)
Card #2 Date: NEGATIVE
Card #3 Date: NEGATIVE
FECAL OCCULT BLD: NEGATIVE
FECAL OCCULT BLD: NEGATIVE
FECAL OCCULT BLD: NEGATIVE
OCCULT BLOOD DATE: NEGATIVE

## 2014-02-09 ENCOUNTER — Other Ambulatory Visit: Payer: Self-pay | Admitting: Family Medicine

## 2014-02-19 ENCOUNTER — Other Ambulatory Visit: Payer: Self-pay | Admitting: Radiology

## 2014-03-24 ENCOUNTER — Ambulatory Visit: Payer: Self-pay | Admitting: Internal Medicine

## 2015-02-15 ENCOUNTER — Other Ambulatory Visit: Payer: Self-pay | Admitting: Family Medicine

## 2015-06-03 ENCOUNTER — Other Ambulatory Visit: Payer: Self-pay | Admitting: Obstetrics and Gynecology

## 2015-06-04 LAB — CYTOLOGY - PAP

## 2015-10-06 ENCOUNTER — Telehealth: Payer: Self-pay | Admitting: Oncology

## 2015-10-06 NOTE — Telephone Encounter (Signed)
error 

## 2015-10-12 ENCOUNTER — Encounter: Payer: Self-pay | Admitting: Hematology and Oncology

## 2015-10-12 ENCOUNTER — Telehealth: Payer: Self-pay | Admitting: Hematology and Oncology

## 2015-10-12 NOTE — Telephone Encounter (Signed)
Appt scheduled with Gudena on 10/16 @345pm . Pt aware to arrive 30 minutes early. Demographics verified. Letter to the referring.

## 2015-10-18 ENCOUNTER — Ambulatory Visit (HOSPITAL_BASED_OUTPATIENT_CLINIC_OR_DEPARTMENT_OTHER): Payer: Managed Care, Other (non HMO) | Admitting: Hematology and Oncology

## 2015-10-18 ENCOUNTER — Encounter: Payer: Self-pay | Admitting: Hematology and Oncology

## 2015-10-18 ENCOUNTER — Other Ambulatory Visit: Payer: Managed Care, Other (non HMO)

## 2015-10-18 VITALS — BP 135/73 | HR 80 | Temp 98.0°F | Resp 18 | Wt 132.9 lb

## 2015-10-18 DIAGNOSIS — E538 Deficiency of other specified B group vitamins: Secondary | ICD-10-CM

## 2015-10-18 DIAGNOSIS — D500 Iron deficiency anemia secondary to blood loss (chronic): Secondary | ICD-10-CM | POA: Insufficient documentation

## 2015-10-18 NOTE — Assessment & Plan Note (Signed)
Profound iron deficiency anemia with iron saturation of 3% hemoglobin of 9.6 with an MCV of 71. Patient is intolerant to oral iron therapy because it causes severe abdominal pain and constipation.  Recommendation: Administer IV iron therapy starting this Friday. I recommended giving Feraheme. I suspect the cause of iron deficiency to be heavy GYN bleeding from her monthly periods. I instructed her to call her gynecologist and to get evaluation for hysterectomy. Previous Hemoccult stools were apparently negative.   Return to clinic in 5 weeks for follow-up with blood work to be done ahead of time.

## 2015-10-18 NOTE — Progress Notes (Signed)
[facility] NOTE  Patient Care Team: Lanice Shirts, MD as PCP - General (Internal Medicine)  CHIEF COMPLAINTS/PURPOSE OF CONSULTATION:  B-12 and iron deficiency anemias  HISTORY OF PRESENTING ILLNESS:  Felicia Ortega 40 y.o. female is here because of recent diagnosis of profound iron deficiency anemia and B-12 deficiency. Patient has been known to have anemia for the past 1-2 years. It appears that periodically she had been on oral iron therapy but every time she takes it causes gastritis and constipation. So she has been intolerant to oral iron. She complains of profuse and heavy menstrual cycles related to fibroid uterus. On the recent blood work she was also noted to have profound B-12 deficiency with her B-12 level of 145. She was given a B-12 injection and another Depo injection is due in a couple weeks. She has not noticed any improvement with the fundus B-12 injection. Patient complains of severe fatigue and shortness of breath exertion.  I reviewed her records extensively and collaborated the history with the patient.  MEDICAL HISTORY:  Past Medical History:  Diagnosis Date  . Allergy   . Chicken pox   . Depression     SURGICAL HISTORY: Past Surgical History:  Procedure Laterality Date  . LASER ABLATION OF THE CERVIX  2005   dysplasia    SOCIAL HISTORY: Social History   Social History  . Marital status: Married    Spouse name: N/A  . Number of children: N/A  . Years of education: N/A   Occupational History  . Not on file.   Social History Main Topics  . Smoking status: Never Smoker  . Smokeless tobacco: Not on file  . Alcohol use Yes     Comment: ocassional  . Drug use: No  . Sexual activity: Not on file   Other Topics Concern  . Not on file   Social History Narrative  . No narrative on file    FAMILY HISTORY: Family History  Problem Relation Age of Onset  . Hyperlipidemia Mother   . Cancer Father     prostate  .  Mental illness Father     depression, anxiety  . Hyperlipidemia Father   . Hodgkin's lymphoma Father   . Cancer Maternal Aunt     breast  . Melanoma Maternal Aunt   . Cancer Maternal Grandmother     lung  . Hyperlipidemia Maternal Grandmother   . Alcohol abuse Maternal Grandfather   . Hyperlipidemia Maternal Grandfather   . Hyperlipidemia Paternal Grandmother   . Alcohol abuse Paternal Grandfather   . Hyperlipidemia Paternal Grandfather     ALLERGIES:  is allergic to sulfa antibiotics.  MEDICATIONS:  Current Outpatient Prescriptions  Medication Sig Dispense Refill  . TRI-PREVIFEM 0.18/0.215/0.25 MG-35 MCG tablet   11  . venlafaxine XR (EFFEXOR-XR) 150 MG 24 hr capsule TAKE ONE CAPSULE BY MOUTH EVERY DAY WITH BREAKFAST 30 capsule 0   No current facility-administered medications for this visit.     REVIEW OF SYSTEMS:   Constitutional: Denies fevers, chills or abnormal night sweats, Complaining of severe fatigue Eyes: Denies blurriness of vision, double vision or watery eyes Ears, nose, mouth, throat, and face: Denies mucositis or sore throat Respiratory: Denies cough, dyspnea or wheezes Cardiovascular: Denies palpitation, chest discomfort or lower extremity swelling Gastrointestinal:  Denies nausea, heartburn or change in bowel habits Skin: Denies abnormal skin rashes Lymphatics: Denies new lymphadenopathy or easy bruising Neurological:Denies numbness, tingling or new weaknesses Behavioral/Psych: Mood is stable, no new changes  All other systems were reviewed with the patient and are negative.  PHYSICAL EXAMINATION: ECOG PERFORMANCE STATUS: 1 - Symptomatic but completely ambulatory  Vitals:   10/18/15 1558  BP: 135/73  Pulse: 80  Resp: 18  Temp: 98 F (36.7 C)   Filed Weights   10/18/15 1558  Weight: 132 lb 14.4 oz (60.3 kg)    GENERAL:alert, no distress and comfortable SKIN: skin color, texture, turgor are normal, no rashes or significant lesions EYES:  normal, conjunctiva are pink and non-injected, sclera clear OROPHARYNX:no exudate, no erythema and lips, buccal mucosa, and tongue normal  NECK: supple, thyroid normal size, non-tender, without nodularity LYMPH:  no palpable lymphadenopathy in the cervical, axillary or inguinal LUNGS: clear to auscultation and percussion with normal breathing effort HEART: regular rate & rhythm and no murmurs and no lower extremity edema ABDOMEN:abdomen soft, non-tender and normal bowel sounds Musculoskeletal:no cyanosis of digits and no clubbing  PSYCH: alert & oriented x 3 with fluent speech NEURO: no focal motor/sensory deficits  LABORATORY DATA:  I have reviewed the data as listed Lab Results  Component Value Date   WBC 5.5 01/16/2014   HGB 9.6 (L) 01/16/2014   HCT 31.4 (L) 01/16/2014   MCV 74.9 (L) 01/16/2014   PLT 612.0 Repeated and verified X2. (H) 01/16/2014   Lab Results  Component Value Date   NA 139 01/16/2014   K 5.1 01/16/2014   CL 107 01/16/2014   CO2 24 01/16/2014    RADIOGRAPHIC STUDIES: I have personally reviewed the radiological reports and agreed with the findings in the report.  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 Plan: 1. Consent for antiparietal cell antibody and anti-intrinsic factor antibody 2. I recommend B 12 injections once a week 4 followed by monthly B-12 injections for 6 months. We can reassess at that time whether or not she will need continued B-12 injections. If she does have pernicious anemia on oral B-12 will not be absorbed and she will need lifelong B-12 injections.  We talked about the difference between B-12 deficiency anemia pernicious anemia which is related to autoantibodies.  Iron deficiency anemia due to chronic blood loss Profound iron deficiency anemia with iron saturation of 3% hemoglobin of 9.6 with an MCV of 71. Patient is intolerant to oral iron therapy because it causes  severe abdominal pain and constipation.  Recommendation: Administer IV iron therapy starting this Friday. I recommended giving Feraheme. I suspect the cause of iron deficiency to be heavy GYN bleeding from her monthly periods. I instructed her to call her gynecologist and to get evaluation for hysterectomy. Previous Hemoccult stools were apparently negative.   Return to clinic in 5 weeks for follow-up with blood work to be done ahead of time.     All questions were answered. The patient knows to call the clinic with any problems, questions or concerns.    Rulon Eisenmenger, MD 10/18/15

## 2015-10-18 NOTE — Assessment & Plan Note (Signed)
B-12 level of 142 which is profound B-12 deficiency. Plan: 1. Consent for antiparietal cell antibody and anti-intrinsic factor antibody 2. I recommend B 12 injections once a week 4 followed by monthly B-12 injections for 6 months. We can reassess at that time whether or not she will need continued B-12 injections. If she does have pernicious anemia on oral B-12 will not be absorbed and she will need lifelong B-12 injections.  We talked about the difference between B-12 deficiency anemia pernicious anemia which is related to autoantibodies.

## 2015-10-19 ENCOUNTER — Telehealth: Payer: Self-pay

## 2015-10-19 NOTE — Telephone Encounter (Signed)
Per request from Dr. Lindi Adie, called pt's PCP to inform them pt needs to be set up for B12 injections weekly x 4 then monthly x 6.  Pt has previously been receiving injections at PCP office but is needing them more frequently.  Called and left VM for Dr. Katina Dung RN with this information as well as my contact information should she need anything additional from me to set this up.

## 2015-10-22 ENCOUNTER — Other Ambulatory Visit (HOSPITAL_BASED_OUTPATIENT_CLINIC_OR_DEPARTMENT_OTHER): Payer: Managed Care, Other (non HMO)

## 2015-10-22 ENCOUNTER — Ambulatory Visit (HOSPITAL_BASED_OUTPATIENT_CLINIC_OR_DEPARTMENT_OTHER): Payer: Managed Care, Other (non HMO)

## 2015-10-22 VITALS — BP 115/67 | HR 67 | Temp 98.1°F | Resp 18

## 2015-10-22 DIAGNOSIS — E538 Deficiency of other specified B group vitamins: Secondary | ICD-10-CM

## 2015-10-22 DIAGNOSIS — D500 Iron deficiency anemia secondary to blood loss (chronic): Secondary | ICD-10-CM

## 2015-10-22 LAB — FERRITIN: Ferritin: 4 ng/ml — ABNORMAL LOW (ref 9–269)

## 2015-10-22 LAB — CBC & DIFF AND RETIC
BASO%: 2.2 % — AB (ref 0.0–2.0)
BASOS ABS: 0.1 10*3/uL (ref 0.0–0.1)
EOS%: 4 % (ref 0.0–7.0)
Eosinophils Absolute: 0.2 10*3/uL (ref 0.0–0.5)
HEMATOCRIT: 30.6 % — AB (ref 34.8–46.6)
HGB: 9.3 g/dL — ABNORMAL LOW (ref 11.6–15.9)
Immature Retic Fract: 14 % — ABNORMAL HIGH (ref 1.60–10.00)
LYMPH%: 24.8 % (ref 14.0–49.7)
MCH: 22.4 pg — ABNORMAL LOW (ref 25.1–34.0)
MCHC: 30.4 g/dL — AB (ref 31.5–36.0)
MCV: 73.6 fL — AB (ref 79.5–101.0)
MONO#: 0.4 10*3/uL (ref 0.1–0.9)
MONO%: 8.7 % (ref 0.0–14.0)
NEUT#: 3 10*3/uL (ref 1.5–6.5)
NEUT%: 60.3 % (ref 38.4–76.8)
PLATELETS: 547 10*3/uL — AB (ref 145–400)
RBC: 4.16 10*6/uL (ref 3.70–5.45)
RDW: 17.4 % — ABNORMAL HIGH (ref 11.2–14.5)
RETIC CT ABS: 31.2 10*3/uL — AB (ref 33.70–90.70)
Retic %: 0.75 % (ref 0.70–2.10)
WBC: 5 10*3/uL (ref 3.9–10.3)
lymph#: 1.2 10*3/uL (ref 0.9–3.3)

## 2015-10-22 LAB — IRON AND TIBC
%SAT: 3 % — ABNORMAL LOW (ref 21–57)
Iron: 18 ug/dL — ABNORMAL LOW (ref 41–142)
TIBC: 583 ug/dL — ABNORMAL HIGH (ref 236–444)
UIBC: 565 ug/dL — AB (ref 120–384)

## 2015-10-22 MED ORDER — SODIUM CHLORIDE 0.9 % IV SOLN
510.0000 mg | Freq: Once | INTRAVENOUS | Status: AC
  Administered 2015-10-22: 510 mg via INTRAVENOUS
  Filled 2015-10-22: qty 17

## 2015-10-22 MED ORDER — SODIUM CHLORIDE 0.9 % IV SOLN
Freq: Once | INTRAVENOUS | Status: AC
  Administered 2015-10-22: 11:00:00 via INTRAVENOUS

## 2015-10-22 NOTE — Patient Instructions (Signed)

## 2015-10-25 LAB — INTRINSIC FACTOR ANTIBODIES: INTRINSIC FACTOR ABS, SERUM: 1 [AU]/ml (ref 0.0–1.1)

## 2015-10-25 LAB — ANTI-PARIETAL ANTIBODY: Parietal Cell Ab: 2.9 Units (ref 0.0–20.0)

## 2015-10-29 ENCOUNTER — Ambulatory Visit (HOSPITAL_BASED_OUTPATIENT_CLINIC_OR_DEPARTMENT_OTHER): Payer: Managed Care, Other (non HMO)

## 2015-10-29 VITALS — BP 113/62 | HR 70 | Temp 98.4°F | Resp 18

## 2015-10-29 DIAGNOSIS — D500 Iron deficiency anemia secondary to blood loss (chronic): Secondary | ICD-10-CM | POA: Diagnosis not present

## 2015-10-29 MED ORDER — SODIUM CHLORIDE 0.9 % IV SOLN
Freq: Once | INTRAVENOUS | Status: AC
  Administered 2015-10-29: 20 mL/h via INTRAVENOUS

## 2015-10-29 MED ORDER — SODIUM CHLORIDE 0.9 % IV SOLN
510.0000 mg | Freq: Once | INTRAVENOUS | Status: AC
  Administered 2015-10-29: 510 mg via INTRAVENOUS
  Filled 2015-10-29: qty 17

## 2015-10-29 NOTE — Progress Notes (Signed)
Patient tolerated treatment well. Patient and vital signs stable upon discharge.  

## 2015-10-29 NOTE — Patient Instructions (Signed)

## 2015-11-18 ENCOUNTER — Other Ambulatory Visit: Payer: Self-pay

## 2015-11-18 DIAGNOSIS — D500 Iron deficiency anemia secondary to blood loss (chronic): Secondary | ICD-10-CM

## 2015-11-19 ENCOUNTER — Other Ambulatory Visit (HOSPITAL_BASED_OUTPATIENT_CLINIC_OR_DEPARTMENT_OTHER): Payer: Managed Care, Other (non HMO)

## 2015-11-19 DIAGNOSIS — D500 Iron deficiency anemia secondary to blood loss (chronic): Secondary | ICD-10-CM

## 2015-11-19 LAB — CBC WITH DIFFERENTIAL/PLATELET
BASO%: 2.4 % — AB (ref 0.0–2.0)
Basophils Absolute: 0.1 10*3/uL (ref 0.0–0.1)
EOS%: 3.7 % (ref 0.0–7.0)
Eosinophils Absolute: 0.2 10*3/uL (ref 0.0–0.5)
HCT: 38.4 % (ref 34.8–46.6)
HGB: 12.2 g/dL (ref 11.6–15.9)
LYMPH%: 24.3 % (ref 14.0–49.7)
MCH: 26 pg (ref 25.1–34.0)
MCHC: 31.8 g/dL (ref 31.5–36.0)
MCV: 81.6 fL (ref 79.5–101.0)
MONO#: 0.5 10*3/uL (ref 0.1–0.9)
MONO%: 9.8 % (ref 0.0–14.0)
NEUT%: 59.8 % (ref 38.4–76.8)
NEUTROS ABS: 3 10*3/uL (ref 1.5–6.5)
Platelets: 455 10*3/uL — ABNORMAL HIGH (ref 145–400)
RBC: 4.71 10*6/uL (ref 3.70–5.45)
RDW: 27.2 % — ABNORMAL HIGH (ref 11.2–14.5)
WBC: 5.1 10*3/uL (ref 3.9–10.3)
lymph#: 1.2 10*3/uL (ref 0.9–3.3)

## 2015-11-19 LAB — IRON AND TIBC
%SAT: 23 % (ref 21–57)
Iron: 85 ug/dL (ref 41–142)
TIBC: 368 ug/dL (ref 236–444)
UIBC: 283 ug/dL (ref 120–384)

## 2015-11-19 LAB — FERRITIN: FERRITIN: 244 ng/mL (ref 9–269)

## 2015-11-21 NOTE — Assessment & Plan Note (Signed)
B-12 level of 142 which is profound B-12 deficiency. Plan: 1. Consent for antiparietal cell antibody and anti-intrinsic factor antibody 2. I recommend B 12 injections once a week 4 followed by monthly B-12 injections for 6 months. We can reassess at that time whether or not she will need continued B-12 injections. If she does have pernicious anemia on oral B-12 will not be absorbed and she will need lifelong B-12 injections.

## 2015-11-21 NOTE — Assessment & Plan Note (Signed)
Profound iron deficiency anemia with iron saturation of 3% hemoglobin of 9.6 with an MCV of 71. Patient is intolerant to oral iron therapy because it causes severe abdominal pain and constipation.  Treatment: Feraheme 10/22/15 I suspect the cause of iron deficiency to be heavy GYN bleeding from Felicia Ortega monthly periods. I instructed Felicia Ortega to call Felicia Ortega gynecologist and to get evaluation for hysterectomy. Previous Hemoccult stools were apparently negative.  RTC in 3 months for recheck of Iron studies and ferritin

## 2015-11-22 ENCOUNTER — Encounter: Payer: Self-pay | Admitting: Hematology and Oncology

## 2015-11-22 ENCOUNTER — Ambulatory Visit (HOSPITAL_BASED_OUTPATIENT_CLINIC_OR_DEPARTMENT_OTHER): Payer: Managed Care, Other (non HMO) | Admitting: Hematology and Oncology

## 2015-11-22 DIAGNOSIS — D500 Iron deficiency anemia secondary to blood loss (chronic): Secondary | ICD-10-CM

## 2015-11-22 DIAGNOSIS — E538 Deficiency of other specified B group vitamins: Secondary | ICD-10-CM | POA: Diagnosis not present

## 2015-11-22 NOTE — Progress Notes (Signed)
Patient Care Team: Lanice Shirts, MD as PCP - General (Internal Medicine)  DIAGNOSIS:  Encounter Diagnoses  Name Primary?  . B12 deficiency due to diet   . Iron deficiency anemia due to chronic blood loss     CHIEF COMPLIANT: Follow-up of iron deficiency anemia and B-12 deficiency  INTERVAL HISTORY: Felicia Ortega is a 40 year old with above-mentioned history of iron deficiency anemia due to heavy menstrual bleeding. She continues to have heavy periods although there were shorter last month. She has not made an appointment to see a gynecologist. After receiving IV iron therapy she felt slightly better. She is currently receiving B-12 injections with her primary care physician once a week for the past 4 weeks. She is going to start once a month injections. She has not noticed any rapid surgeon energy but overall she feels slightly better.  REVIEW OF SYSTEMS:   Constitutional: Denies fevers, chills or abnormal weight loss Eyes: Denies blurriness of vision Ears, nose, mouth, throat, and face: Denies mucositis or sore throat Respiratory: Denies cough, dyspnea or wheezes Cardiovascular: Denies palpitation, chest discomfort Gastrointestinal:  Denies nausea, heartburn or change in bowel habits Skin: Denies abnormal skin rashes Lymphatics: Denies new lymphadenopathy or easy bruising Neurological:Denies numbness, tingling or new weaknesses Behavioral/Psych: Mood is stable, no new changes  Extremities: No lower extremity edema  All other systems were reviewed with the patient and are negative.  I have reviewed the past medical history, past surgical history, social history and family history with the patient and they are unchanged from previous note.  ALLERGIES:  is allergic to sulfa antibiotics.  MEDICATIONS:  Current Outpatient Prescriptions  Medication Sig Dispense Refill  . TRI-PREVIFEM 0.18/0.215/0.25 MG-35 MCG tablet   11  . venlafaxine XR (EFFEXOR-XR) 150 MG 24 hr  capsule TAKE ONE CAPSULE BY MOUTH EVERY DAY WITH BREAKFAST 30 capsule 0   No current facility-administered medications for this visit.     PHYSICAL EXAMINATION: ECOG PERFORMANCE STATUS: 1 - Symptomatic but completely ambulatory  Vitals:   11/22/15 1132  BP: 125/64  Pulse: 77  Resp: 18  Temp: 98.4 F (36.9 C)   Filed Weights   11/22/15 1132  Weight: 133 lb 6.4 oz (60.5 kg)    GENERAL:alert, no distress and comfortable SKIN: skin color, texture, turgor are normal, no rashes or significant lesions EYES: normal, Conjunctiva are pink and non-injected, sclera clear OROPHARYNX:no exudate, no erythema and lips, buccal mucosa, and tongue normal  NECK: supple, thyroid normal size, non-tender, without nodularity LYMPH:  no palpable lymphadenopathy in the cervical, axillary or inguinal LUNGS: clear to auscultation and percussion with normal breathing effort HEART: regular rate & rhythm and no murmurs and no lower extremity edema ABDOMEN:abdomen soft, non-tender and normal bowel sounds MUSCULOSKELETAL:no cyanosis of digits and no clubbing  NEURO: alert & oriented x 3 with fluent speech, no focal motor/sensory deficits EXTREMITIES: No lower extremity edema   LABORATORY DATA:  I have reviewed the data as listed   Chemistry      Component Value Date/Time   NA 139 01/16/2014 1044   K 5.1 01/16/2014 1044   CL 107 01/16/2014 1044   CO2 24 01/16/2014 1044   BUN 20 01/16/2014 1044   CREATININE 1.03 01/16/2014 1044      Component Value Date/Time   CALCIUM 9.7 01/16/2014 1044   ALKPHOS 44 01/16/2014 1044   AST 17 01/16/2014 1044   ALT 9 01/16/2014 1044   BILITOT 0.3 01/16/2014 1044       Lab  Results  Component Value Date   WBC 5.1 11/19/2015   HGB 12.2 11/19/2015   HCT 38.4 11/19/2015   MCV 81.6 11/19/2015   PLT 455 (H) 11/19/2015   NEUTROABS 3.0 11/19/2015     ASSESSMENT & PLAN:  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 Plan: B  12 injections monthly B-12 injections for 6 months (with her primary care physician). We can reassess at 3 months whether or not she will need continued B-12 injections. Patient does not have pernicious anemia with negative antiparietal cell and anti-intrinsic factor antibodies.  Iron deficiency anemia due to chronic blood loss Profound iron deficiency anemia with iron saturation of 3% hemoglobin of 9.6 with an MCV of 71. After iron replacement her hemoglobin was 12.2, iron saturation 23 244 % and ferritin Patient was intolerant to oral iron therapy because it causes severe abdominal pain and constipation.  Treatment: Feraheme 10/22/15 I suspect the cause of iron deficiency to be heavy GYN bleeding from her monthly periods. I instructed her to call her gynecologist and to get evaluation to help stop the heavy bleeding Previous Hemoccult stools were apparently negative.  RTC in 3 months for recheck of Iron studies and ferritin.   No orders of the defined types were placed in this encounter.  The patient has a good understanding of the overall plan. she agrees with it. she will call with any problems that may develop before the next visit here.   Rulon Eisenmenger, MD 11/22/15

## 2016-02-21 ENCOUNTER — Other Ambulatory Visit (HOSPITAL_BASED_OUTPATIENT_CLINIC_OR_DEPARTMENT_OTHER): Payer: Managed Care, Other (non HMO)

## 2016-02-21 DIAGNOSIS — D500 Iron deficiency anemia secondary to blood loss (chronic): Secondary | ICD-10-CM | POA: Diagnosis not present

## 2016-02-21 LAB — CBC WITH DIFFERENTIAL/PLATELET
BASO%: 1.6 % (ref 0.0–2.0)
BASOS ABS: 0.1 10*3/uL (ref 0.0–0.1)
EOS%: 3.2 % (ref 0.0–7.0)
Eosinophils Absolute: 0.2 10*3/uL (ref 0.0–0.5)
HEMATOCRIT: 38.7 % (ref 34.8–46.6)
HGB: 13 g/dL (ref 11.6–15.9)
LYMPH%: 27.6 % (ref 14.0–49.7)
MCH: 30.9 pg (ref 25.1–34.0)
MCHC: 33.6 g/dL (ref 31.5–36.0)
MCV: 91.9 fL (ref 79.5–101.0)
MONO#: 0.4 10*3/uL (ref 0.1–0.9)
MONO%: 7.1 % (ref 0.0–14.0)
NEUT#: 3.4 10*3/uL (ref 1.5–6.5)
NEUT%: 60.5 % (ref 38.4–76.8)
Platelets: 416 10*3/uL — ABNORMAL HIGH (ref 145–400)
RBC: 4.21 10*6/uL (ref 3.70–5.45)
RDW: 13.5 % (ref 11.2–14.5)
WBC: 5.7 10*3/uL (ref 3.9–10.3)
lymph#: 1.6 10*3/uL (ref 0.9–3.3)

## 2016-02-22 ENCOUNTER — Encounter: Payer: Self-pay | Admitting: Hematology and Oncology

## 2016-02-22 ENCOUNTER — Ambulatory Visit (HOSPITAL_BASED_OUTPATIENT_CLINIC_OR_DEPARTMENT_OTHER): Payer: Managed Care, Other (non HMO) | Admitting: Hematology and Oncology

## 2016-02-22 DIAGNOSIS — E538 Deficiency of other specified B group vitamins: Secondary | ICD-10-CM | POA: Diagnosis not present

## 2016-02-22 DIAGNOSIS — D500 Iron deficiency anemia secondary to blood loss (chronic): Secondary | ICD-10-CM | POA: Diagnosis not present

## 2016-02-22 LAB — IRON AND TIBC
%SAT: 36 % (ref 21–57)
IRON: 132 ug/dL (ref 41–142)
TIBC: 370 ug/dL (ref 236–444)
UIBC: 238 ug/dL (ref 120–384)

## 2016-02-22 LAB — FERRITIN: FERRITIN: 22 ng/mL (ref 9–269)

## 2016-02-22 NOTE — Assessment & Plan Note (Signed)
B-12 level of 142 which is profound B-12 deficiency. Plan: B 12 injections monthly B-12 injections for 6 months (with her primary care physician). We can reassess at 3 months whether or not she will need continued B-12 injections. Patient does not have pernicious anemia with negative antiparietal cell and anti-intrinsic factor antibodies.

## 2016-02-22 NOTE — Assessment & Plan Note (Signed)
Profound iron deficiency anemia with iron saturation of 3% hemoglobin of 9.6 with an MCV of 71. After iron replacement her hemoglobin was 12.2, iron saturation 23 244 % and ferritin Patient was intolerant to oral iron therapy because it causes severe abdominal pain and constipation.  Treatment: Feraheme 10/22/15 Lab review: I suspect the cause of iron deficiency to be heavy GYN bleeding from her monthly periods.

## 2016-02-22 NOTE — Progress Notes (Signed)
Patient Care Team: Lanice Shirts, MD as PCP - General (Internal Medicine)  DIAGNOSIS:  Encounter Diagnoses  Name Primary?  . Iron deficiency anemia due to chronic blood loss   . B12 deficiency due to diet     CHIEF COMPLIANT: Follow-up of labs and studies  INTERVAL HISTORY: Felicia Ortega is a 41 year old with above-mentioned history of iron deficiency anemia: IV iron therapy in October. She is here for a follow-up and reports that overall she's been feeling fairly well although lately has 2 weeks she has felt little bit more fatigue. She continues to have heavy menstrual bleeding for 7 days have blood loss. She is time to see her gynecologist regarding this.  REVIEW OF SYSTEMS:   Constitutional: Denies fevers, chills or abnormal weight loss Eyes: Denies blurriness of vision Ears, nose, mouth, throat, and face: Denies mucositis or sore throat Respiratory: Denies cough, dyspnea or wheezes Cardiovascular: Denies palpitation, chest discomfort Gastrointestinal:  Denies nausea, heartburn or change in bowel habits Skin: Denies abnormal skin rashes Lymphatics: Denies new lymphadenopathy or easy bruising Neurological:Denies numbness, tingling or new weaknesses Behavioral/Psych: Mood is stable, no new changes  Extremities: No lower extremity edema  All other systems were reviewed with the patient and are negative.  I have reviewed the past medical history, past surgical history, social history and family history with the patient and they are unchanged from previous note.  ALLERGIES:  is allergic to sulfa antibiotics.  MEDICATIONS:  Current Outpatient Prescriptions  Medication Sig Dispense Refill  . TRI-PREVIFEM 0.18/0.215/0.25 MG-35 MCG tablet   11  . venlafaxine XR (EFFEXOR-XR) 150 MG 24 hr capsule TAKE ONE CAPSULE BY MOUTH EVERY DAY WITH BREAKFAST 30 capsule 0   No current facility-administered medications for this visit.     PHYSICAL EXAMINATION: ECOG PERFORMANCE  STATUS: 1 - Symptomatic but completely ambulatory  Vitals:   02/22/16 1402  BP: 121/68  Pulse: 69  Resp: 18  Temp: 97.7 F (36.5 C)   Filed Weights   02/22/16 1402  Weight: 131 lb 8 oz (59.6 kg)    GENERAL:alert, no distress and comfortable SKIN: skin color, texture, turgor are normal, no rashes or significant lesions EYES: normal, Conjunctiva are pink and non-injected, sclera clear OROPHARYNX:no exudate, no erythema and lips, buccal mucosa, and tongue normal  NECK: supple, thyroid normal size, non-tender, without nodularity LYMPH:  no palpable lymphadenopathy in the cervical, axillary or inguinal LUNGS: clear to auscultation and percussion with normal breathing effort HEART: regular rate & rhythm and no murmurs and no lower extremity edema ABDOMEN:abdomen soft, non-tender and normal bowel sounds MUSCULOSKELETAL:no cyanosis of digits and no clubbing  NEURO: alert & oriented x 3 with fluent speech, no focal motor/sensory deficits EXTREMITIES: No lower extremity edema  LABORATORY DATA:  I have reviewed the data as listed   Chemistry      Component Value Date/Time   NA 139 01/16/2014 1044   K 5.1 01/16/2014 1044   CL 107 01/16/2014 1044   CO2 24 01/16/2014 1044   BUN 20 01/16/2014 1044   CREATININE 1.03 01/16/2014 1044      Component Value Date/Time   CALCIUM 9.7 01/16/2014 1044   ALKPHOS 44 01/16/2014 1044   AST 17 01/16/2014 1044   ALT 9 01/16/2014 1044   BILITOT 0.3 01/16/2014 1044       Lab Results  Component Value Date   WBC 5.7 02/21/2016   HGB 13.0 02/21/2016   HCT 38.7 02/21/2016   MCV 91.9 02/21/2016   PLT  416 (H) 02/21/2016   NEUTROABS 3.4 02/21/2016    ASSESSMENT & PLAN:  Iron deficiency anemia due to chronic blood loss Profound iron deficiency anemia with iron saturation of 3% hemoglobin of 9.6 with an MCV of 71. After iron replacement her hemoglobin was 12.2, iron saturation 23 244 % and ferritin Patient was intolerant to oral iron therapy  because it causes severe abdominal pain and constipation.  Treatment: Feraheme 10/22/15 Lab review:Hemoglobin 13, iron saturation 36%, ferritin 22 There is no indication for IV iron infusion. However I suspect that she may be dropping next time we see her back in 3 months. I suspect the cause of iron deficiency to be heavy GYN bleeding from her monthly periods. She will discuss with her gynecologist about different treatment options.  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 Current treatment: B 12 injections monthly (with her primary care physician). Patient does not have pernicious anemia with negative antiparietal cell and anti-intrinsic factor antibodies.   I spent 25 minutes talking to the patient of which more than half was spent in counseling and coordination of care.  No orders of the defined types were placed in this encounter.  The patient has a good understanding of the overall plan. she agrees with it. she will call with any problems that may develop before the next visit here.   Rulon Eisenmenger, MD 02/22/16

## 2016-05-22 ENCOUNTER — Other Ambulatory Visit (HOSPITAL_BASED_OUTPATIENT_CLINIC_OR_DEPARTMENT_OTHER): Payer: Managed Care, Other (non HMO)

## 2016-05-22 DIAGNOSIS — D500 Iron deficiency anemia secondary to blood loss (chronic): Secondary | ICD-10-CM

## 2016-05-22 LAB — CBC WITH DIFFERENTIAL/PLATELET
BASO%: 2.4 % — AB (ref 0.0–2.0)
Basophils Absolute: 0.1 10*3/uL (ref 0.0–0.1)
EOS%: 2.9 % (ref 0.0–7.0)
Eosinophils Absolute: 0.2 10*3/uL (ref 0.0–0.5)
HEMATOCRIT: 36.3 % (ref 34.8–46.6)
HGB: 12.1 g/dL (ref 11.6–15.9)
LYMPH#: 1.2 10*3/uL (ref 0.9–3.3)
LYMPH%: 23.3 % (ref 14.0–49.7)
MCH: 30.5 pg (ref 25.1–34.0)
MCHC: 33.3 g/dL (ref 31.5–36.0)
MCV: 91.4 fL (ref 79.5–101.0)
MONO#: 0.6 10*3/uL (ref 0.1–0.9)
MONO%: 10.8 % (ref 0.0–14.0)
NEUT%: 60.6 % (ref 38.4–76.8)
NEUTROS ABS: 3.1 10*3/uL (ref 1.5–6.5)
PLATELETS: 449 10*3/uL — AB (ref 145–400)
RBC: 3.97 10*6/uL (ref 3.70–5.45)
RDW: 12.8 % (ref 11.2–14.5)
WBC: 5.1 10*3/uL (ref 3.9–10.3)

## 2016-05-23 ENCOUNTER — Ambulatory Visit: Payer: Self-pay

## 2016-05-23 ENCOUNTER — Ambulatory Visit: Payer: Self-pay | Admitting: Hematology and Oncology

## 2016-05-23 ENCOUNTER — Other Ambulatory Visit: Payer: Self-pay | Admitting: Hematology and Oncology

## 2016-05-23 LAB — IRON AND TIBC
%SAT: 9 % — AB (ref 21–57)
IRON: 39 ug/dL — AB (ref 41–142)
TIBC: 458 ug/dL — AB (ref 236–444)
UIBC: 419 ug/dL — ABNORMAL HIGH (ref 120–384)

## 2016-05-23 LAB — FERRITIN: FERRITIN: 5 ng/mL — AB (ref 9–269)

## 2016-05-23 NOTE — Assessment & Plan Note (Deleted)
Prior treatment: Feraheme 10/22/2015 (patient is intolerant to oral iron therapy) Cause of iron deficiency: Heavy uterine bleeding  Lab work review: Hemoglobin 12.1, MCV 91.4, platelets 449, iron studies have not been done.  B-12 deficiency due to diet: Previously B-12 level was 142 Current treatment: Monthly B-12 injections with her primary care physician. previous workup was negative for antiparietal cell and anti-intrinsic factor antibodies

## 2016-05-26 ENCOUNTER — Telehealth: Payer: Self-pay | Admitting: Hematology and Oncology

## 2016-05-26 NOTE — Telephone Encounter (Signed)
[facility] - cxd 5/31 f/u per [facility] desk nurse - patient to have infusion only. Follow up rescheduled to 6/7. Left message for patient.

## 2016-06-01 ENCOUNTER — Ambulatory Visit: Payer: Self-pay | Admitting: Hematology and Oncology

## 2016-06-01 ENCOUNTER — Ambulatory Visit (HOSPITAL_BASED_OUTPATIENT_CLINIC_OR_DEPARTMENT_OTHER): Payer: Managed Care, Other (non HMO)

## 2016-06-01 VITALS — BP 93/61 | HR 72 | Temp 97.9°F | Resp 16

## 2016-06-01 DIAGNOSIS — D500 Iron deficiency anemia secondary to blood loss (chronic): Secondary | ICD-10-CM

## 2016-06-01 MED ORDER — SODIUM CHLORIDE 0.9 % IV SOLN
510.0000 mg | Freq: Once | INTRAVENOUS | Status: AC
  Administered 2016-06-01: 510 mg via INTRAVENOUS
  Filled 2016-06-01: qty 17

## 2016-06-01 MED ORDER — SODIUM CHLORIDE 0.9 % IV SOLN
Freq: Once | INTRAVENOUS | Status: AC
  Administered 2016-06-01: 11:00:00 via INTRAVENOUS

## 2016-06-01 NOTE — Progress Notes (Signed)
After IV insertion, patient reported feeling light-headed, stating "I don't feel good all of the sudden". IV NS running (see eMAR), legs elevated and cool washcloth applied to forehead. Beverage of ice water provided. Patient reported no change in dizziness and skin appeared pale. VS taken (see flowsheets). Patient reported not having eaten anything yet this morning. Orange juice and graham cracker with peanut butter provided. Patient began to report feeling "much better", with color returning to her face and VSS (see flowsheets). Will continue to monitor.

## 2016-06-01 NOTE — Patient Instructions (Signed)

## 2016-06-06 ENCOUNTER — Other Ambulatory Visit: Payer: Self-pay | Admitting: Obstetrics and Gynecology

## 2016-06-07 LAB — CYTOLOGY - PAP

## 2016-06-08 ENCOUNTER — Ambulatory Visit (HOSPITAL_BASED_OUTPATIENT_CLINIC_OR_DEPARTMENT_OTHER): Payer: Managed Care, Other (non HMO) | Admitting: Hematology and Oncology

## 2016-06-08 DIAGNOSIS — E538 Deficiency of other specified B group vitamins: Secondary | ICD-10-CM | POA: Diagnosis not present

## 2016-06-08 NOTE — Assessment & Plan Note (Signed)
Profound iron deficiency anemia with iron saturation of 3% hemoglobin of 9.6 with an MCV of 71. After iron replacement her hemoglobin was 12.2, iron saturation 23244 % and ferritin Patient was intolerant to oral iron therapy because it causes severe abdominal pain and constipation.  Treatment: Feraheme 10/22/15 Lab review:Hemoglobin 13, iron saturation 36%, ferritin 22 There is no indication for IV iron infusion.  Cause of iron deficiency is the heavy GYN bleeding from her monthly periods.  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 Current treatment: B 12 injections monthly (with her primary care physician). Patient does not have pernicious anemia.

## 2016-06-08 NOTE — Progress Notes (Signed)
Patient Care Team: Schoenhoff, Altamese Cabal, MD as PCP - General (Internal Medicine)  DIAGNOSIS:  Encounter Diagnosis  Name Primary?  . B12 deficiency due to diet   Iron deficiency anemia  CHIEF COMPLIANT: Follow-up of B-12 deficiency and iron deficiency  INTERVAL HISTORY: Felicia Ortega is a 41 year old with above-mentioned history of B-12 deficiency who is here for a follow-up. She also has iron deficiency who has recently received IV iron therapy. She felt remarkably better.  REVIEW OF SYSTEMS:   Constitutional: Denies fevers, chills or abnormal weight loss Eyes: Denies blurriness of vision Ears, nose, mouth, throat, and face: Denies mucositis or sore throat Respiratory: Denies cough, dyspnea or wheezes Cardiovascular: Denies palpitation, chest discomfort Gastrointestinal:  Denies nausea, heartburn or change in bowel habits Skin: Denies abnormal skin rashes Lymphatics: Denies new lymphadenopathy or easy bruising Neurological:Denies numbness, tingling or new weaknesses Behavioral/Psych: Mood is stable, no new changes  Extremities: No lower extremity edema  All other systems were reviewed with the patient and are negative.  I have reviewed the past medical history, past surgical history, social history and family history with the patient and they are unchanged from previous note.  ALLERGIES:  is allergic to sulfa antibiotics.  MEDICATIONS:  Current Outpatient Prescriptions  Medication Sig Dispense Refill  . TRI-PREVIFEM 0.18/0.215/0.25 MG-35 MCG tablet   11  . venlafaxine XR (EFFEXOR-XR) 150 MG 24 hr capsule TAKE ONE CAPSULE BY MOUTH EVERY DAY WITH BREAKFAST 30 capsule 0   No current facility-administered medications for this visit.     PHYSICAL EXAMINATION: ECOG PERFORMANCE STATUS: 1 - Symptomatic but completely ambulatory  Vitals:   06/08/16 0911  BP: 110/62  Pulse: 97  Resp: 18  Temp: 97.3 F (36.3 C)   Filed Weights   06/08/16 0911  Weight: 124 lb 11.2  oz (56.6 kg)    GENERAL:alert, no distress and comfortable SKIN: skin color, texture, turgor are normal, no rashes or significant lesions EYES: normal, Conjunctiva are pink and non-injected, sclera clear OROPHARYNX:no exudate, no erythema and lips, buccal mucosa, and tongue normal  NECK: supple, thyroid normal size, non-tender, without nodularity LYMPH:  no palpable lymphadenopathy in the cervical, axillary or inguinal LUNGS: clear to auscultation and percussion with normal breathing effort HEART: regular rate & rhythm and no murmurs and no lower extremity edema ABDOMEN:abdomen soft, non-tender and normal bowel sounds MUSCULOSKELETAL:no cyanosis of digits and no clubbing  NEURO: alert & oriented x 3 with fluent speech, no focal motor/sensory deficits EXTREMITIES: No lower extremity edema  LABORATORY DATA:  I have reviewed the data as listed   Chemistry      Component Value Date/Time   NA 139 01/16/2014 1044   K 5.1 01/16/2014 1044   CL 107 01/16/2014 1044   CO2 24 01/16/2014 1044   BUN 20 01/16/2014 1044   CREATININE 1.03 01/16/2014 1044      Component Value Date/Time   CALCIUM 9.7 01/16/2014 1044   ALKPHOS 44 01/16/2014 1044   AST 17 01/16/2014 1044   ALT 9 01/16/2014 1044   BILITOT 0.3 01/16/2014 1044       Lab Results  Component Value Date   WBC 5.1 05/22/2016   HGB 12.1 05/22/2016   HCT 36.3 05/22/2016   MCV 91.4 05/22/2016   PLT 449 (H) 05/22/2016   NEUTROABS 3.1 05/22/2016    ASSESSMENT & PLAN:  B12 deficiency due to diet Profound iron deficiency anemia with iron saturation of 3% hemoglobin of 9.6 with an MCV of 71. After iron replacement  her hemoglobin was 12.2, iron saturation 23244 % and ferritin Patient was intolerant to oral iron therapy because it causes severe abdominal pain and constipation.  Treatment: Feraheme 10/22/15 Lab review:Hemoglobin 13, iron saturation 36%, ferritin 22 There is no indication for IV iron infusion.  Cause of iron  deficiency is the heavy GYN bleeding from her monthly periods.  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 Current treatment: B 12 injections monthly (with her primary care physician). Patient does not have pernicious anemia. Its reasonable to switch her to oral B 12.  I spent 15 minutes talking to the patient of which more than half was spent in counseling and coordination of care.  No orders of the defined types were placed in this encounter.  The patient has a good understanding of the overall plan. she agrees with it. she will call with any problems that may develop before the next visit here.   Rulon Eisenmenger, MD 06/08/16

## 2016-09-05 ENCOUNTER — Encounter (HOSPITAL_COMMUNITY)
Admission: RE | Admit: 2016-09-05 | Discharge: 2016-09-05 | Disposition: A | Discharge status: Home / Self Care | Payer: Managed Care, Other (non HMO) | Source: Ambulatory Visit | Attending: Obstetrics and Gynecology | Admitting: Obstetrics and Gynecology

## 2016-09-05 ENCOUNTER — Encounter (HOSPITAL_COMMUNITY): Payer: Self-pay

## 2016-09-05 DIAGNOSIS — Z01812 Encounter for preprocedural laboratory examination: Secondary | ICD-10-CM | POA: Diagnosis not present

## 2016-09-05 HISTORY — DX: Other specified postprocedural states: Z98.890

## 2016-09-05 HISTORY — DX: Unspecified disturbances of skin sensation: R20.9

## 2016-09-05 HISTORY — DX: Headache: R51

## 2016-09-05 HISTORY — DX: Anemia, unspecified: D64.9

## 2016-09-05 HISTORY — DX: Other specified postprocedural states: R11.2

## 2016-09-05 HISTORY — DX: Headache, unspecified: R51.9

## 2016-09-05 LAB — BASIC METABOLIC PANEL
ANION GAP: 10 (ref 5–15)
BUN: 14 mg/dL (ref 6–20)
CO2: 24 mmol/L (ref 22–32)
Calcium: 9.5 mg/dL (ref 8.9–10.3)
Chloride: 101 mmol/L (ref 101–111)
Creatinine, Ser: 0.89 mg/dL (ref 0.44–1.00)
GFR calc Af Amer: >60 mL/min (ref >60)
GLUCOSE: 88 mg/dL (ref 65–99)
Potassium: 4.1 mmol/L (ref 3.5–5.1)
SODIUM: 135 mmol/L (ref 135–145)

## 2016-09-05 LAB — CBC
HCT: 38.1 % (ref 36.0–46.0)
Hemoglobin: 13.3 g/dL (ref 12.0–15.0)
MCH: 31.4 pg (ref 26.0–34.0)
MCHC: 34.9 g/dL (ref 30.0–36.0)
MCV: 89.9 fL (ref 78.0–100.0)
PLATELETS: 422 10*3/uL — AB (ref 150–400)
RBC: 4.24 MIL/uL (ref 3.87–5.11)
RDW: 13.9 % (ref 11.5–15.5)
WBC: 4.6 10*3/uL (ref 4.0–10.5)

## 2016-09-05 NOTE — Patient Instructions (Signed)
Your procedure is scheduled on:  Friday, Sept. 14, 2018  Enter through the [employer] [facility] at:  6:00 AM  Pick up the phone at the desk and dial [phone].  Call this number if you have problems the morning of surgery: [phone].  Remember: Do NOT eat food or drink after:  Midnight Thursday  Take these medicines the morning of surgery with a SIP OF WATER:  None  Stop ALL herbal medications at this time  Do NOT smoke the day of surgery.  Do NOT wear jewelry (body piercing), metal hair clips/bobby pins, make-up, artifical eyelashes or nail polish. Do NOT wear lotions, powders, or perfumes.  You may wear deodorant. Do NOT shave for 48 hours prior to surgery. Do NOT bring valuables to the hospital. Contacts, dentures, or bridgework may not be worn into surgery.  Leave suitcase in car.  After surgery it may be brought to your room.  For patients admitted to the hospital, checkout time is 11:00 AM the day of discharge.  Bring a copy of your healthcare power of attorney and living will documents.

## 2016-09-12 NOTE — H&P (Signed)
41 y.o. yo complains of anemia and symptomatic fibroid uterus. Previously: "Pelvic 10x9x7; EM 1.3; multiple fibroids, biggest 8 cm and pushing EM, possibly in EM. RO normal. LO not seen. No FF. \cb3 Very heavy periods for 4 days changing once an hour. Having iron infusions for anemia. "  "Symptomatic fibroid uterus, getting iron transfusions for anemia from excessive bleeding. Submucosal fibroid pushing on endometrium. \cb3 Offered - 1. Lupron Depo to shrink fibroids 2. Uterine artery embolization with Interventional Radiology also to shrink fibroid. 3. Study involving Selective Progesterone Receptor Modulator, also to shrink fibroid. 4. TLH/Salpingectomies with Robot. \I d/w pt all the risks/side effects/benefits and alternatives of each therapy. Pt is worn out from bleeding and anemia and strongly desires definitive and permanent therapy.\cb3 Pt desires to schedule TLH/salpingectomies. \cb3 No SUI. No bleeding after SA or random IM- no need for preop EMB. Pt to call if she changes mind and will go with other therapy. ".    Past Medical History:  Diagnosis Date  . Allergy   . Anemia   . Bilateral cold feet   . Chicken pox   . Depression   . Headache    Migraines  . PONV (postoperative nausea and vomiting)    Past Surgical History:  Procedure Laterality Date  . BREAST BIOPSY Left   . LASER ABLATION OF THE CERVIX  2005   dysplasia  . MOUTH SURGERY    . WISDOM TOOTH EXTRACTION      Social History   Social History  . Marital status: Married    Spouse name: N/A  . Number of children: N/A  . Years of education: N/A   Occupational History  . Not on file.   Social History Main Topics  . Smoking status: Never Smoker  . Smokeless tobacco: Never Used  . Alcohol use Yes     Comment: rare  . Drug use: No  . Sexual activity: Not on file   Other Topics Concern  . Not on file   Social History Narrative  . No narrative on file    No current facility-administered medications on  file prior to encounter.    No current outpatient prescriptions on file prior to encounter.    Allergies  Allergen Reactions  . Sulfa Antibiotics Hives  . Epinephrine Palpitations    Sweaty and faints    @VITALS2 @  Lungs: clear to ascultation Cor:  RRR Abdomen:  soft, nontender, nondistended. Ex:  no cords, erythema Pelvic:   . Vulva: no masses, no atrophy, no lesions\ls1   . Vagina: no tenderness, no erythema, no abnormal vaginal discharge, no vesicle(s) or ulcers, no cystocele, no rectocele\ls1   . Cervix: grossly normal, no discharge, no cervical motion tenderness, sample taken for a Pap smear\ls1   . Uterus: normal shape, midline, no uterine prolapse, non-tender, enlarged (9-10)\ls1   . Bladder/Urethra: normal meatus, no urethral discharge, no urethral mass, bladder non distended, Urethra well supported\ls1   . Adnexa/Parametria: no parametrial tenderness, no parametrial mass, no adnexal tenderness, no ovarian mass  A:  Anemia requiring iron transfusions, symptomatic fibroid uterus.  For Robotic TLH/salpingectomies/cysto/possible BSO.   P: All risks, benefits and alternatives d/w patient and she desires to proceed.  Patient has undergone a modified bowel prep and will receive preop antibiotics and SCDs during the operation.     Ladena Jacquez A

## 2016-09-15 ENCOUNTER — Observation Stay (HOSPITAL_COMMUNITY)
Admission: RE | Admit: 2016-09-15 | Discharge: 2016-09-16 | Disposition: A | Discharge status: Home / Self Care | Payer: Managed Care, Other (non HMO) | Source: Ambulatory Visit | Attending: Obstetrics and Gynecology | Admitting: Obstetrics and Gynecology

## 2016-09-15 ENCOUNTER — Encounter (HOSPITAL_COMMUNITY): Payer: Self-pay | Admitting: Emergency Medicine

## 2016-09-15 ENCOUNTER — Encounter (HOSPITAL_COMMUNITY)
Admission: RE | Disposition: A | Discharge status: Home / Self Care | Payer: Self-pay | Source: Ambulatory Visit | Attending: Obstetrics and Gynecology

## 2016-09-15 ENCOUNTER — Ambulatory Visit (HOSPITAL_COMMUNITY): Payer: Managed Care, Other (non HMO) | Admitting: Certified Registered Nurse Anesthetist

## 2016-09-15 DIAGNOSIS — N802 Endometriosis of fallopian tube: Secondary | ICD-10-CM | POA: Diagnosis not present

## 2016-09-15 DIAGNOSIS — N838 Other noninflammatory disorders of ovary, fallopian tube and broad ligament: Secondary | ICD-10-CM | POA: Insufficient documentation

## 2016-09-15 DIAGNOSIS — D250 Submucous leiomyoma of uterus: Secondary | ICD-10-CM | POA: Diagnosis not present

## 2016-09-15 DIAGNOSIS — Z79899 Other long term (current) drug therapy: Secondary | ICD-10-CM | POA: Insufficient documentation

## 2016-09-15 DIAGNOSIS — Z9889 Other specified postprocedural states: Secondary | ICD-10-CM

## 2016-09-15 DIAGNOSIS — F329 Major depressive disorder, single episode, unspecified: Secondary | ICD-10-CM | POA: Insufficient documentation

## 2016-09-15 DIAGNOSIS — D649 Anemia, unspecified: Secondary | ICD-10-CM | POA: Diagnosis not present

## 2016-09-15 DIAGNOSIS — N920 Excessive and frequent menstruation with regular cycle: Secondary | ICD-10-CM | POA: Insufficient documentation

## 2016-09-15 HISTORY — PX: ROBOTIC ASSISTED TOTAL HYSTERECTOMY WITH SALPINGECTOMY: SHX6679

## 2016-09-15 HISTORY — DX: Family history of other specified conditions: Z84.89

## 2016-09-15 HISTORY — PX: CYSTOSCOPY: SHX5120

## 2016-09-15 LAB — PREGNANCY, URINE: PREG TEST UR: NEGATIVE

## 2016-09-15 SURGERY — ROBOTIC ASSISTED TOTAL HYSTERECTOMY WITH SALPINGECTOMY
Anesthesia: General | Site: Bladder

## 2016-09-15 MED ORDER — ONDANSETRON HCL 4 MG/2ML IJ SOLN: 4.0000 mg | Freq: Four times a day (QID) | INTRAMUSCULAR | Status: DC | PRN

## 2016-09-15 MED ORDER — LIDOCAINE HCL (CARDIAC) 20 MG/ML IV SOLN
INTRAVENOUS | Status: DC | PRN
  Administered 2016-09-15: 50 mg via INTRAVENOUS

## 2016-09-15 MED ORDER — ROPIVACAINE HCL 5 MG/ML IJ SOLN
INTRAMUSCULAR | Status: AC
  Filled 2016-09-15: qty 30

## 2016-09-15 MED ORDER — KETOROLAC TROMETHAMINE 30 MG/ML IJ SOLN
30.0000 mg | Freq: Four times a day (QID) | INTRAMUSCULAR | Status: DC
  Administered 2016-09-15: 30 mg via INTRAVENOUS
  Filled 2016-09-15 (×2): qty 1

## 2016-09-15 MED ORDER — KETOROLAC TROMETHAMINE 30 MG/ML IJ SOLN
INTRAMUSCULAR | Status: DC | PRN
  Administered 2016-09-15: 30 mg via INTRAVENOUS

## 2016-09-15 MED ORDER — MENTHOL 3 MG MT LOZG: 1.0000 | LOZENGE | OROMUCOSAL | Status: DC | PRN

## 2016-09-15 MED ORDER — METHYLENE BLUE 0.5 % INJ SOLN
INTRAVENOUS | Status: AC
  Filled 2016-09-15: qty 10

## 2016-09-15 MED ORDER — STERILE WATER FOR IRRIGATION IR SOLN
Status: DC | PRN
  Administered 2016-09-15: 1000 mL

## 2016-09-15 MED ORDER — SUGAMMADEX SODIUM 200 MG/2ML IV SOLN
INTRAVENOUS | Status: DC | PRN
  Administered 2016-09-15: 150 mg via INTRAVENOUS

## 2016-09-15 MED ORDER — ONDANSETRON HCL 4 MG/2ML IJ SOLN
INTRAMUSCULAR | Status: DC | PRN
  Administered 2016-09-15: 4 mg via INTRAVENOUS

## 2016-09-15 MED ORDER — PROPOFOL 10 MG/ML IV BOLUS
INTRAVENOUS | Status: AC
  Filled 2016-09-15: qty 20

## 2016-09-15 MED ORDER — EPHEDRINE SULFATE 50 MG/ML IJ SOLN
INTRAMUSCULAR | Status: DC | PRN
  Administered 2016-09-15: 10 mg via INTRAVENOUS

## 2016-09-15 MED ORDER — KETOROLAC TROMETHAMINE 30 MG/ML IJ SOLN
INTRAMUSCULAR | Status: AC
  Filled 2016-09-15: qty 1

## 2016-09-15 MED ORDER — PROPOFOL 10 MG/ML IV BOLUS
INTRAVENOUS | Status: DC | PRN
  Administered 2016-09-15: 200 mg via INTRAVENOUS

## 2016-09-15 MED ORDER — LIDOCAINE HCL (PF) 1 % IJ SOLN
INTRAMUSCULAR | Status: AC
  Filled 2016-09-15: qty 5

## 2016-09-15 MED ORDER — IBUPROFEN 800 MG PO TABS
800.0000 mg | ORAL_TABLET | Freq: Three times a day (TID) | ORAL | Status: DC | PRN
  Administered 2016-09-15: 800 mg via ORAL
  Filled 2016-09-15: qty 1

## 2016-09-15 MED ORDER — ARTIFICIAL TEARS OPHTHALMIC OINT
TOPICAL_OINTMENT | OPHTHALMIC | Status: AC
  Filled 2016-09-15: qty 3.5

## 2016-09-15 MED ORDER — LIDOCAINE HCL (CARDIAC) 20 MG/ML IV SOLN
INTRAVENOUS | Status: AC
  Filled 2016-09-15: qty 5

## 2016-09-15 MED ORDER — SODIUM CHLORIDE 0.9 % IJ SOLN
INTRAMUSCULAR | Status: AC
  Filled 2016-09-15: qty 20

## 2016-09-15 MED ORDER — SCOPOLAMINE 1 MG/3DAYS TD PT72
MEDICATED_PATCH | TRANSDERMAL | Status: AC
  Administered 2016-09-15: 1.5 mg via TRANSDERMAL
  Filled 2016-09-15: qty 1

## 2016-09-15 MED ORDER — OXYCODONE HCL 5 MG PO TABS: 5.0000 mg | ORAL_TABLET | Freq: Once | ORAL | Status: DC | PRN

## 2016-09-15 MED ORDER — BACITRACIN ZINC 500 UNIT/GM EX OINT
TOPICAL_OINTMENT | CUTANEOUS | Status: AC
  Filled 2016-09-15: qty 28.35

## 2016-09-15 MED ORDER — ROCURONIUM BROMIDE 100 MG/10ML IV SOLN
INTRAVENOUS | Status: DC | PRN
  Administered 2016-09-15: 10 mg via INTRAVENOUS
  Administered 2016-09-15: 40 mg via INTRAVENOUS

## 2016-09-15 MED ORDER — PROMETHAZINE HCL 25 MG/ML IJ SOLN: 6.2500 mg | INTRAMUSCULAR | Status: DC | PRN

## 2016-09-15 MED ORDER — MIDAZOLAM HCL 2 MG/2ML IJ SOLN
INTRAMUSCULAR | Status: AC
  Filled 2016-09-15: qty 2

## 2016-09-15 MED ORDER — DEXAMETHASONE SODIUM PHOSPHATE 4 MG/ML IJ SOLN
INTRAMUSCULAR | Status: AC
  Filled 2016-09-15: qty 1

## 2016-09-15 MED ORDER — MIDAZOLAM HCL 2 MG/2ML IJ SOLN
INTRAMUSCULAR | Status: DC | PRN
  Administered 2016-09-15: 2 mg via INTRAVENOUS

## 2016-09-15 MED ORDER — HYDROMORPHONE HCL 1 MG/ML IJ SOLN
0.2500 mg | INTRAMUSCULAR | Status: DC | PRN
  Administered 2016-09-15: 0.5 mg via INTRAVENOUS

## 2016-09-15 MED ORDER — SUGAMMADEX SODIUM 200 MG/2ML IV SOLN
INTRAVENOUS | Status: AC
  Filled 2016-09-15: qty 2

## 2016-09-15 MED ORDER — OXYCODONE-ACETAMINOPHEN 5-325 MG PO TABS
1.0000 | ORAL_TABLET | ORAL | Status: DC | PRN
  Administered 2016-09-15: 1 via ORAL
  Filled 2016-09-15: qty 1

## 2016-09-15 MED ORDER — FAMOTIDINE 20 MG PO TABS
20.0000 mg | ORAL_TABLET | Freq: Once | ORAL | Status: AC
  Administered 2016-09-15: 20 mg via ORAL

## 2016-09-15 MED ORDER — FENTANYL CITRATE (PF) 250 MCG/5ML IJ SOLN
INTRAMUSCULAR | Status: AC
  Filled 2016-09-15: qty 5

## 2016-09-15 MED ORDER — PHENYLEPHRINE HCL 10 MG/ML IJ SOLN
INTRAMUSCULAR | Status: DC | PRN
  Administered 2016-09-15: .06 mg via INTRAVENOUS

## 2016-09-15 MED ORDER — HYDROMORPHONE HCL 1 MG/ML IJ SOLN
INTRAMUSCULAR | Status: AC
  Filled 2016-09-15: qty 0.5

## 2016-09-15 MED ORDER — PHENYLEPHRINE 40 MCG/ML (10ML) SYRINGE FOR IV PUSH (FOR BLOOD PRESSURE SUPPORT)
PREFILLED_SYRINGE | INTRAVENOUS | Status: AC
  Filled 2016-09-15: qty 10

## 2016-09-15 MED ORDER — LACTATED RINGERS IV SOLN
INTRAVENOUS | Status: DC
  Administered 2016-09-15 (×3): via INTRAVENOUS

## 2016-09-15 MED ORDER — SODIUM CHLORIDE 0.9 % IJ SOLN
INTRAMUSCULAR | Status: AC
  Filled 2016-09-15: qty 10

## 2016-09-15 MED ORDER — ACETAMINOPHEN 500 MG PO TABS
ORAL_TABLET | ORAL | Status: AC
  Administered 2016-09-15: 1000 mg via ORAL
  Filled 2016-09-15: qty 2

## 2016-09-15 MED ORDER — ONDANSETRON HCL 4 MG/2ML IJ SOLN
INTRAMUSCULAR | Status: AC
  Filled 2016-09-15: qty 2

## 2016-09-15 MED ORDER — SODIUM CHLORIDE 0.9 % IR SOLN
Status: DC | PRN
  Administered 2016-09-15: 3000 mL

## 2016-09-15 MED ORDER — FUROSEMIDE 10 MG/ML IJ SOLN
INTRAMUSCULAR | Status: AC
  Filled 2016-09-15: qty 2

## 2016-09-15 MED ORDER — CEFAZOLIN SODIUM-DEXTROSE 2-4 GM/100ML-% IV SOLN
2.0000 g | Freq: Once | INTRAVENOUS | Status: AC
  Administered 2016-09-15: 2 g via INTRAVENOUS

## 2016-09-15 MED ORDER — FENTANYL CITRATE (PF) 100 MCG/2ML IJ SOLN
INTRAMUSCULAR | Status: DC | PRN
  Administered 2016-09-15: 50 ug via INTRAVENOUS
  Administered 2016-09-15: 100 ug via INTRAVENOUS
  Administered 2016-09-15 (×2): 50 ug via INTRAVENOUS

## 2016-09-15 MED ORDER — SCOPOLAMINE 1 MG/3DAYS TD PT72
1.0000 | MEDICATED_PATCH | Freq: Once | TRANSDERMAL | Status: DC
  Administered 2016-09-15: 1.5 mg via TRANSDERMAL

## 2016-09-15 MED ORDER — LACTATED RINGERS IV SOLN
INTRAVENOUS | Status: DC
  Administered 2016-09-15: 12:00:00 via INTRAVENOUS

## 2016-09-15 MED ORDER — FAMOTIDINE 20 MG PO TABS
ORAL_TABLET | ORAL | Status: AC
  Administered 2016-09-15: 20 mg via ORAL
  Filled 2016-09-15: qty 1

## 2016-09-15 MED ORDER — LIDOCAINE HCL 1 % IJ SOLN
INTRAMUSCULAR | Status: AC
  Filled 2016-09-15: qty 20

## 2016-09-15 MED ORDER — FUROSEMIDE 10 MG/ML IJ SOLN
INTRAMUSCULAR | Status: DC | PRN
  Administered 2016-09-15: 5 mg via INTRAMUSCULAR

## 2016-09-15 MED ORDER — OXYCODONE HCL 5 MG/5ML PO SOLN: 5.0000 mg | Freq: Once | ORAL | Status: DC | PRN

## 2016-09-15 MED ORDER — ONDANSETRON HCL 4 MG PO TABS: 4.0000 mg | ORAL_TABLET | Freq: Four times a day (QID) | ORAL | Status: DC | PRN

## 2016-09-15 MED ORDER — ACETAMINOPHEN 500 MG PO TABS
1000.0000 mg | ORAL_TABLET | Freq: Once | ORAL | Status: AC
  Administered 2016-09-15: 1000 mg via ORAL

## 2016-09-15 MED ORDER — DEXAMETHASONE SODIUM PHOSPHATE 10 MG/ML IJ SOLN
INTRAMUSCULAR | Status: DC | PRN
  Administered 2016-09-15: 7.5 mg via INTRAVENOUS

## 2016-09-15 MED ORDER — ROPIVACAINE HCL 5 MG/ML IJ SOLN
INTRAMUSCULAR | Status: DC | PRN
  Administered 2016-09-15: 60 mL

## 2016-09-15 SURGICAL SUPPLY — 56 items
BARRIER ADHS 3X4 INTERCEED (GAUZE/BANDAGES/DRESSINGS) IMPLANT
CANISTER SUCT 3000ML PPV (MISCELLANEOUS) ×4 IMPLANT
CATH FOLEY 2WAY SLVR  5CC 16FR (CATHETERS) ×2
CATH FOLEY 2WAY SLVR 5CC 16FR (CATHETERS) ×2 IMPLANT
CLOTH BEACON ORANGE TIMEOUT ST (SAFETY) ×4 IMPLANT
CONT PATH 16OZ SNAP LID 3702 (MISCELLANEOUS) ×4 IMPLANT
COVER BACK TABLE 60X90IN (DRAPES) ×8 IMPLANT
COVER TIP SHEARS 8 DVNC (MISCELLANEOUS) ×2 IMPLANT
COVER TIP SHEARS 8MM DA VINCI (MISCELLANEOUS) ×2
DECANTER SPIKE VIAL GLASS SM (MISCELLANEOUS) ×8 IMPLANT
DERMABOND ADVANCED (GAUZE/BANDAGES/DRESSINGS) ×2
DERMABOND ADVANCED .7 DNX12 (GAUZE/BANDAGES/DRESSINGS) ×2 IMPLANT
DURAPREP 26ML APPLICATOR (WOUND CARE) ×4 IMPLANT
ELECT REM PT RETURN 9FT ADLT (ELECTROSURGICAL) ×4
ELECTRODE REM PT RTRN 9FT ADLT (ELECTROSURGICAL) ×2 IMPLANT
GAUZE VASELINE 3X9 (GAUZE/BANDAGES/DRESSINGS) IMPLANT
GLOVE BIO SURGEON STRL SZ7 (GLOVE) ×12 IMPLANT
GLOVE BIOGEL PI IND STRL 7.0 (GLOVE) ×4 IMPLANT
GLOVE BIOGEL PI INDICATOR 7.0 (GLOVE) ×4
GOWN STRL REUS W/TWL LRG LVL3 (GOWN DISPOSABLE) ×8 IMPLANT
GRASPER BIPOLAR FEN DA VINCI (INSTRUMENTS)
GRASPER BPLR FEN DVNC (INSTRUMENTS) IMPLANT
KIT ACCESSORY DA VINCI DISP (KITS) ×2
KIT ACCESSORY DVNC DISP (KITS) ×2 IMPLANT
LEGGING LITHOTOMY PAIR STRL (DRAPES) ×4 IMPLANT
MANIPULATOR ADVINCU DEL 2.5 PL (MISCELLANEOUS) IMPLANT
MANIPULATOR ADVINCU DEL 3.0 PL (MISCELLANEOUS) ×4 IMPLANT
MANIPULATOR ADVINCU DEL 3.5 PL (MISCELLANEOUS) IMPLANT
MANIPULATOR ADVINCU DEL 4.0 PL (MISCELLANEOUS) IMPLANT
NEEDLE INSUFFLATION 120MM (ENDOMECHANICALS) ×4 IMPLANT
PACK ROBOT WH (CUSTOM PROCEDURE TRAY) ×4 IMPLANT
PACK ROBOTIC GOWN (GOWN DISPOSABLE) ×4 IMPLANT
PACK TRENDGUARD 450 HYBRID PRO (MISCELLANEOUS) ×2 IMPLANT
PAD PREP 24X48 CUFFED NSTRL (MISCELLANEOUS) ×4 IMPLANT
POUCH LAPAROSCOPIC INSTRUMENT (MISCELLANEOUS) ×4 IMPLANT
PROTECTOR NERVE ULNAR (MISCELLANEOUS) ×8 IMPLANT
SET CYSTO W/LG BORE CLAMP LF (SET/KITS/TRAYS/PACK) ×4 IMPLANT
SET IRRIG TUBING LAPAROSCOPIC (IRRIGATION / IRRIGATOR) ×4 IMPLANT
SET TRI-LUMEN FLTR TB AIRSEAL (TUBING) ×4 IMPLANT
SUT DVC VLOC 180 0 12IN GS21 (SUTURE)
SUT VIC AB 2-0 CT2 27 (SUTURE) ×8 IMPLANT
SUT VIC AB 2-0 UR6 27 (SUTURE) ×4 IMPLANT
SUT VICRYL RAPIDE 3 0 (SUTURE) ×8 IMPLANT
SUT VLOC 180 0 9IN  GS21 (SUTURE) ×2
SUT VLOC 180 0 9IN GS21 (SUTURE) ×2 IMPLANT
SUTURE DVC VLC 180 0 12IN GS21 (SUTURE) IMPLANT
SYR 50ML LL SCALE MARK (SYRINGE) ×4 IMPLANT
TIP RUMI ORANGE 6.7MMX12CM (TIP) IMPLANT
TOWEL OR 17X24 6PK STRL BLUE (TOWEL DISPOSABLE) ×12 IMPLANT
TRENDGUARD 450 HYBRID PRO PACK (MISCELLANEOUS) ×4
TROCAR DILATING TIP 12MM 150MM (ENDOMECHANICALS) ×4 IMPLANT
TROCAR DISP BLADELESS 8 DVNC (TROCAR) ×2 IMPLANT
TROCAR DISP BLADELESS 8MM (TROCAR) ×2
TROCAR PORT AIRSEAL 5X120 (TROCAR) ×4 IMPLANT
TROCAR PORT AIRSEAL 8X120 (TROCAR) IMPLANT
WATER STERILE IRR 1000ML POUR (IV SOLUTION) ×4 IMPLANT

## 2016-09-15 NOTE — Transfer of Care (Signed)
Immediate Anesthesia Transfer of Care Note  Patient: Felicia Ortega  Procedure(s) Performed: Procedure(s) with comments: ROBOTIC ASSISTED TOTAL HYSTERECTOMY WITH SALPINGECTOMY (N/A) - with BSO CYSTOSCOPY (N/A)  Patient Location: PACU  Anesthesia Type:General  Level of Consciousness: awake, alert  and oriented  Airway & Oxygen Therapy: Patient Spontanous Breathing and Patient connected to nasal cannula oxygen  Post-op Assessment: Report given to RN and Post -op Vital signs reviewed and stable  Post vital signs: Reviewed and stable  Last Vitals:  Vitals:   09/15/16 0623  BP: 120/76  Pulse: 77  Resp: 16  Temp: 36.8 C  SpO2: 100%    Last Pain:  Vitals:   09/15/16 0623  TempSrc: Oral      Patients Stated Pain Goal: 5 (45/40/98 1191)  Complications: No apparent anesthesia complications

## 2016-09-15 NOTE — Brief Op Note (Signed)
09/15/2016  9:58 AM  PATIENT:  Felicia Ortega  41 y.o. female  PRE-OPERATIVE DIAGNOSIS:  SUBMUCOSAL FIBROID, menorrhagia and anemia  POST-OPERATIVE DIAGNOSIS:  Same  PROCEDURE:  Procedure(s) with comments: ROBOTIC ASSISTED TOTAL HYSTERECTOMY WITH SALPINGECTOMY (N/A) - with BSO CYSTOSCOPY (N/A)  SURGEON:  Surgeon(s) and Role:    Bobbye Charleston, MD - Primary  ASSISTANTS: Dr. Jerelyn Charles   ANESTHESIA:   general  EBL:  Total I/O In: 2000 [I.V.:2000] Out: 100 [Urine:75; Blood:25]   LOCAL MEDICATIONS USED:  OTHER Ropivicaine   SPECIMEN:  Source of Specimen:  Uterus, cervix, fibroid and bilateral tubes.  DISPOSITION OF SPECIMEN:  PATHOLOGY  COUNTS:  YES  TOURNIQUET:  * No tourniquets in log *  DICTATION: .Note written in [place]: Admit for overnight observation  PATIENT DISPOSITION:  PACU - hemodynamically stable.   Delay start of Pharmacological VTE agent (>24hrs) due to surgical blood loss or risk of bleeding: not applicable  Complications:  Small superficial separation of skin above urethral orifice, likely done while removing uterus.  Findings:  13 weeks size uterus.  R and L  ovaries were normal.   The ureters were identified during multiple points of the case and were always out of the field of dissection.  On cystoscopy, the bladder was intact and bilateral spill was seen from each ureteral orriface.    Medications:  Ancef.  Bupivicaine.    Technique:  After adequate anesthesia was achieved the patient was positioned, prepped and draped in usual sterile fashion.  A speculum was placed in the vagina and the cervix dilated with pratt dilators.  The Advincula 3 cm Koh ring were assembled and placed in proper fashion.  The  Speculum was removed and the bladder catheterized with a foley.    Attention was turned to the abdomen where a 1 cm incision was made 1 cm above the umbilicus.  The veress needle was introduced without aspiration of bowel  contents or blood and the abdomen insufflated. The long 12 mm trocar was placed and the other three trocar sites were marked out, all approximately 10 cm from each other and the camera.  Two 8.5 mm trocars were placed on either side of the camera port and a 5 mm assistant port was placed 3 cm above the L iliac crest.  All trocars were inserted under direct visualization of the camera.  The patient was placed in trendelenburg and then the Robot docked.  The PK forceps were placed on arm 2 and the Hot shears on arm 1 and introduced under direct visualization of the camera.  I then broke scrub and sat down at the console.  The above findings were noted and the ureters identified well out of the field of dissection.  The right fallopian tube was isolated and cauterized with the PK.  The Utero-ovarian ligament was then divided with the PK cautery and shears.  The posterior broad ligament was then divided with the hot shears until the uterosacral ligament.  The Broad and cardinal ligaments were then cauterized against the cervix to the level of the Koh ring, securing the uterine artery.  Each pedicle was then incised with the shears.  The anterior leaf was then incised at the reflection of the vessico-uterine junction and the lateral bladder retracted inferiorly after the round ligament had been divided with the PK forceps.  The left tube was cauterized with the PK and divided with the shears;  then the left utero-ovarian ligament divided with  the PK forceps and the scissors.  The round ligament was divided as well and the posterior leaf of the broad ligament then divided with the hot shears. The broad and cardinal ligaments were then cauterized on the left in the same way.   At the level of the internal os, the uterine arteries were bilaterally cauterized with the PK.  The ureters were identified well out of the field of dissection.  .   The bladder was then able to be retracted inferiorly and the vesico-uterine  fascia was incised in the midline until the bladder was removed one cm below the Koh ring.  The hot shears then circumferentially incised the vagina at the level of the reflection on the [facility] ring.  Once the uterus and cervix were amputated, the uterus was brought through vagina but unable to be passed in toto; the anterior fibroid was was almost entirely excised from the uterus with sharp, blunt and cautery dissection.  The uterus was then removed through the vagina.  Cautery was used to insure hemostasis of the cuff.  Once hemostasis was achieved, the instruments were changed to the mega needle driver and mega suture cut needle driver and the cuff was closed with a running stitches of 0-vicryl V loc.  The ureters were peristalsing bilaterally well and very lateral to the areas of operation.    The Robot was then undocked and I scrubbed back in.  The needle was removed and Bupivicaine was introduced into the pelvis.  The fascia of the 12 mm trocar was closed with a figure of 8 stitch of 0 vicryl.  The skin incisions were closed with subcuticular stitches of 3-0 vicryl Rapide and Dermabond.  All instruments were removed from the vagina and cystoscopy performed, revealing an intact bladder and vigourous spill of urine from each ureteral orifice  The cystoscope was removed.  .  There was a 2 cm superficial separation of the skin just above the urethra and this was closed with 3-oVicryl R. The patient taken to the recovery room in stable condition.  Caroll Weinheimer A

## 2016-09-15 NOTE — Progress Notes (Signed)
There has been no change in the patients history, status or exam since the history and physical.  Vitals:   09/15/16 0623  BP: 120/76  Pulse: 77  Resp: 16  Temp: 98.2 F (36.8 C)  TempSrc: Oral  SpO2: 100%    Lab Results  Component Value Date   WBC 4.6 09/05/2016   HGB 13.3 09/05/2016   HCT 38.1 09/05/2016   MCV 89.9 09/05/2016   PLT 422 (H) 09/05/2016    Felicia Ortega

## 2016-09-15 NOTE — Op Note (Signed)
09/15/2016  9:58 AM  PATIENT:  [name]  41 y.o. female  PRE-OPERATIVE DIAGNOSIS:  SUBMUCOSAL FIBROID, menorrhagia and anemia  POST-OPERATIVE DIAGNOSIS:  Same  PROCEDURE:  Procedure(s) with comments: ROBOTIC ASSISTED TOTAL HYSTERECTOMY WITH SALPINGECTOMY (N/A) - with BSO CYSTOSCOPY (N/A)  SURGEON:  Surgeon(s) and Role:    Bobbye Charleston, MD - Primary  ASSISTANTS: Dr. Jerelyn Charles   ANESTHESIA:   general  EBL:  Total I/O In: 2000 [I.V.:2000] Out: 100 [Urine:75; Blood:25]   LOCAL MEDICATIONS USED:  OTHER Ropivicaine   SPECIMEN:  Source of Specimen:  Uterus, cervix, fibroid and bilateral tubes.  DISPOSITION OF SPECIMEN:  PATHOLOGY  COUNTS:  YES  TOURNIQUET:  * No tourniquets in log *  DICTATION: .Note written in [place]: Admit for overnight observation  PATIENT DISPOSITION:  PACU - hemodynamically stable.   Delay start of Pharmacological VTE agent (>24hrs) due to surgical blood loss or risk of bleeding: not applicable  Complications:  Small superficial separation of skin above urethral orifice, likely done while removing uterus.  Findings:  13 weeks size uterus.  R and L  ovaries were normal.   The ureters were identified during multiple points of the case and were always out of the field of dissection.  On cystoscopy, the bladder was intact and bilateral spill was seen from each ureteral orriface.    Medications:  Ancef.  Bupivicaine.    Technique:  After adequate anesthesia was achieved the patient was positioned, prepped and draped in usual sterile fashion.  A speculum was placed in the vagina and the cervix dilated with pratt dilators.  The Advincula 3 cm Koh ring were assembled and placed in proper fashion.  The  Speculum was removed and the bladder catheterized with a foley.    Attention was turned to the abdomen where a 1 cm incision was made 1 cm above the umbilicus.  The veress needle was introduced without aspiration of bowel  contents or blood and the abdomen insufflated. The long 12 mm trocar was placed and the other three trocar sites were marked out, all approximately 10 cm from each other and the camera.  Two 8.5 mm trocars were placed on either side of the camera port and a 5 mm assistant port was placed 3 cm above the L iliac crest.  All trocars were inserted under direct visualization of the camera.  The patient was placed in trendelenburg and then the Robot docked.  The PK forceps were placed on arm 2 and the Hot shears on arm 1 and introduced under direct visualization of the camera.  I then broke scrub and sat down at the console.  The above findings were noted and the ureters identified well out of the field of dissection.  The right fallopian tube was isolated and cauterized with the PK.  The Utero-ovarian ligament was then divided with the PK cautery and shears.  The posterior broad ligament was then divided with the hot shears until the uterosacral ligament.  The Broad and cardinal ligaments were then cauterized against the cervix to the level of the Koh ring, securing the uterine artery.  Each pedicle was then incised with the shears.  The anterior leaf was then incised at the reflection of the vessico-uterine junction and the lateral bladder retracted inferiorly after the round ligament had been divided with the PK forceps.  The left tube was cauterized with the PK and divided with the shears;  then the left utero-ovarian ligament divided with  the PK forceps and the scissors.  The round ligament was divided as well and the posterior leaf of the broad ligament then divided with the hot shears. The broad and cardinal ligaments were then cauterized on the left in the same way.   At the level of the internal os, the uterine arteries were bilaterally cauterized with the PK.  The ureters were identified well out of the field of dissection.  .   The bladder was then able to be retracted inferiorly and the vesico-uterine  fascia was incised in the midline until the bladder was removed one cm below the Koh ring.  The hot shears then circumferentially incised the vagina at the level of the reflection on the [facility] ring.  Once the uterus and cervix were amputated, the uterus was brought through vagina but unable to be passed in toto; the anterior fibroid was was almost entirely excised from the uterus with sharp, blunt and cautery dissection.  The uterus was then removed through the vagina.  Cautery was used to insure hemostasis of the cuff.  Once hemostasis was achieved, the instruments were changed to the mega needle driver and mega suture cut needle driver and the cuff was closed with a running stitches of 0-vicryl V loc.  The ureters were peristalsing bilaterally well and very lateral to the areas of operation.    The Robot was then undocked and I scrubbed back in.  The needle was removed and Bupivicaine was introduced into the pelvis.  The fascia of the 12 mm trocar was closed with a figure of 8 stitch of 0 vicryl.  The skin incisions were closed with subcuticular stitches of 3-0 vicryl Rapide and Dermabond.  All instruments were removed from the vagina and cystoscopy performed, revealing an intact bladder and vigourous spill of urine from each ureteral orifice  The cystoscope was removed.  .  There was a 2 cm superficial separation of the skin just above the urethra and this was closed with 3-oVicryl R. The patient taken to the recovery room in stable condition.  Keylani Perlstein A

## 2016-09-15 NOTE — Anesthesia Preprocedure Evaluation (Addendum)
Anesthesia Evaluation  Patient identified by MRN, date of birth, ID band Patient awake    Reviewed: Allergy & Precautions, NPO status , Patient's Chart, lab work & pertinent test results  History of Anesthesia Complications (+) PONV and history of anesthetic complications  Airway Mallampati: I  TM Distance: >3 FB Neck ROM: Full    Dental no notable dental hx.    Pulmonary neg pulmonary ROS,    Pulmonary exam normal breath sounds clear to auscultation       Cardiovascular negative cardio ROS Normal cardiovascular exam Rhythm:Regular Rate:Normal     Neuro/Psych Depression negative neurological ROS     GI/Hepatic negative GI ROS, Neg liver ROS,   Endo/Other  negative endocrine ROS  Renal/GU negative Renal ROS     Musculoskeletal negative musculoskeletal ROS (+)   Abdominal   Peds  Hematology   Anesthesia Other Findings   Reproductive/Obstetrics SUBMUCOSAL FIBROID                            Anesthesia Physical Anesthesia Plan  ASA: II  Anesthesia Plan: General   Post-op Pain Management:    Induction: Intravenous  PONV Risk Score and Plan: 4 or greater and Ondansetron, Dexamethasone, Midazolam and Scopolamine patch - Pre-op  Airway Management Planned: Oral ETT  Additional Equipment:   Intra-op Plan:   Post-operative Plan: Extubation in OR  Informed Consent: I have reviewed the patients History and Physical, chart, labs and discussed the procedure including the risks, benefits and alternatives for the proposed anesthesia with the patient or authorized representative who has indicated his/her understanding and acceptance.   Dental advisory given  Plan Discussed with: CRNA  Anesthesia Plan Comments:         Anesthesia Quick Evaluation

## 2016-09-15 NOTE — Anesthesia Postprocedure Evaluation (Signed)
Anesthesia Post Note  Patient: [name]  Procedure(s) Performed: Procedure(s) (LRB): ROBOTIC ASSISTED TOTAL HYSTERECTOMY WITH SALPINGECTOMY (N/A) CYSTOSCOPY (N/A)     Patient location during evaluation: PACU Anesthesia Type: General Level of consciousness: awake and sedated Pain management: pain level controlled Vital Signs Assessment: post-procedure vital signs reviewed and stable Respiratory status: spontaneous breathing Cardiovascular status: stable Postop Assessment: no apparent nausea or vomiting Anesthetic complications: no    Last Vitals:  Vitals:   09/15/16 1030 09/15/16 1045  BP: (!) 117/59 117/65  Pulse: 79 85  Resp: 17 14  Temp:    SpO2: 100% 100%    Last Pain:  Vitals:   09/15/16 0623  TempSrc: Oral   Pain Goal: Patients Stated Pain Goal: 5 (09/15/16 0623)               Elsye Mccollister JR,JOHN Mateo Flow

## 2016-09-15 NOTE — Anesthesia Procedure Notes (Signed)
Procedure Name: Intubation Date/Time: 09/15/2016 7:25 AM Performed by: Bufford Spikes Pre-anesthesia Checklist: Patient identified, Emergency Drugs available, Suction available and Patient being monitored Patient Re-evaluated:Patient Re-evaluated prior to induction Oxygen Delivery Method: Circle system utilized Preoxygenation: Pre-oxygenation with 100% oxygen Induction Type: IV induction Ventilation: Mask ventilation without difficulty Laryngoscope Size: Miller and 2 Grade View: Grade I Tube type: Oral Tube size: 7.0 mm Number of attempts: 1 Airway Equipment and Method: Stylet Placement Confirmation: ETT inserted through vocal cords under direct vision,  positive ETCO2 and breath sounds checked- equal and bilateral Secured at: 21 cm Tube secured with: Tape Dental Injury: Teeth and Oropharynx as per pre-operative assessment

## 2016-09-16 ENCOUNTER — Encounter (HOSPITAL_COMMUNITY): Payer: Self-pay | Admitting: Obstetrics and Gynecology

## 2016-09-16 DIAGNOSIS — D250 Submucous leiomyoma of uterus: Secondary | ICD-10-CM | POA: Diagnosis not present

## 2016-09-16 MED ORDER — OXYCODONE-ACETAMINOPHEN 5-325 MG PO TABS: 1.0000 | ORAL_TABLET | ORAL | 0 refills | Status: AC | PRN

## 2016-09-16 NOTE — Progress Notes (Signed)
Patient is eating, ambulating, and voiding.  Pain control is good.  BP (!) 111/59 (BP Location: Right Arm)   Pulse 66   Temp (!) 97.4 F (36.3 C) (Oral)   Resp 16   LMP 09/14/2016 Comment: Continous bleeding  SpO2 100%   lungs:   clear to auscultation cor:    RRR Abdomen:  soft, appropriate tenderness, incisions intact and without erythema or exudate. ex:    no cords   Lab Results  Component Value Date   WBC 4.6 09/05/2016   HGB 13.3 09/05/2016   HCT 38.1 09/05/2016   MCV 89.9 09/05/2016   PLT 422 (H) 09/05/2016    A/P  Routine care.  Expect d/c per plan.

## 2016-09-16 NOTE — Discharge Summary (Signed)
Physician Discharge Summary  Patient ID: Felicia Ortega MRN: [id number] DOB/AGE: 43-May-1977 43 y.o.  Admit date: 09/15/2016 Discharge date: 09/16/2016  Admission Diagnoses:symptomatic fibroid uterus, menorrhagia, anemia  Discharge Diagnoses: same Active Problems:   Postoperative state   Discharged Condition: good  Hospital Course: Uncomplicated robotic TLH/Salpingectomies/cysto  Consults: None  Significant Diagnostic Studies: none  Treatments: surgery:  Uncomplicated robotic TLH/Salpingectomies/cysto    Discharge Exam: Blood pressure (!) 111/59, pulse 66, temperature (!) 97.4 F (36.3 C), temperature source Oral, resp. rate 16, last menstrual period 09/14/2016, SpO2 100 %.   Disposition: 01-Home or Self Care  Discharge Instructions    Call MD for:  temperature >100.4    Complete by:  As directed    Diet - low sodium heart healthy    Complete by:  As directed    Discharge instructions    Complete by:  As directed    No driving on narcotics, no sexual activity for 2 weeks.   Increase activity slowly    Complete by:  As directed    May shower / Bathe    Complete by:  As directed    Shower, no bath for 2 weeks.   Remove dressing in 24 hours    Complete by:  As directed    Sexual Activity Restrictions    Complete by:  As directed    No sexual activity for 2 weeks.     Allergies as of 09/16/2016      Reactions   Sulfa Antibiotics Hives   Epinephrine Palpitations   Sweaty and faints      Medication List    TAKE these medications   oxyCODONE-acetaminophen 5-325 MG tablet Commonly known as:  PERCOCET/ROXICET Take 1-2 tablets by mouth every 4 (four) hours as needed for severe pain (moderate to severe pain (when tolerating fluids)).   spironolactone 100 MG tablet Commonly known as:  ALDACTONE Take 100 mg by mouth every morning.   venlafaxine XR 150 MG 24 hr capsule Commonly known as:  EFFEXOR-XR Take 150 mg by mouth at bedtime.            Discharge  Care Instructions        Start     Ordered   09/16/16 0000  oxyCODONE-acetaminophen (PERCOCET/ROXICET) 5-325 MG tablet  Every 4 hours PRN     09/16/16 0922   09/16/16 0000  Discharge instructions    Comments:  No driving on narcotics, no sexual activity for 2 weeks.   09/16/16 0922   09/16/16 0000  Diet - low sodium heart healthy     09/16/16 0922   09/16/16 0000  Increase activity slowly     09/16/16 8315   09/16/16 0000  May shower / Bathe    Comments:  Shower, no bath for 2 weeks.   09/16/16 1761   09/16/16 0000  Sexual Activity Restrictions    Comments:  No sexual activity for 2 weeks.   09/16/16 0922   09/16/16 0000  Remove dressing in 24 hours     09/16/16 0922   09/16/16 0000  Call MD for:  temperature >100.4     09/16/16 6073     Follow-up Information    Bobbye Charleston, MD Follow up in 2 week(s).   Specialty:  Obstetrics and Gynecology Contact information: [address] [place] [address] [phone]           Signed: Oluwatoyin Banales A 09/16/2016, 9:23 AM

## 2016-12-06 ENCOUNTER — Other Ambulatory Visit: Payer: Self-pay

## 2016-12-06 DIAGNOSIS — D500 Iron deficiency anemia secondary to blood loss (chronic): Secondary | ICD-10-CM

## 2016-12-07 ENCOUNTER — Other Ambulatory Visit: Payer: Self-pay

## 2016-12-07 ENCOUNTER — Other Ambulatory Visit (HOSPITAL_BASED_OUTPATIENT_CLINIC_OR_DEPARTMENT_OTHER): Payer: Managed Care, Other (non HMO)

## 2016-12-07 DIAGNOSIS — D500 Iron deficiency anemia secondary to blood loss (chronic): Secondary | ICD-10-CM | POA: Diagnosis not present

## 2016-12-07 LAB — CBC WITH DIFFERENTIAL/PLATELET
BASO%: 2.3 % — AB (ref 0.0–2.0)
Basophils Absolute: 0.1 10*3/uL (ref 0.0–0.1)
EOS ABS: 0.2 10*3/uL (ref 0.0–0.5)
EOS%: 4.4 % (ref 0.0–7.0)
HEMATOCRIT: 39.7 % (ref 34.8–46.6)
HGB: 13.3 g/dL (ref 11.6–15.9)
LYMPH#: 1 10*3/uL (ref 0.9–3.3)
LYMPH%: 22.7 % (ref 14.0–49.7)
MCH: 31.1 pg (ref 25.1–34.0)
MCHC: 33.5 g/dL (ref 31.5–36.0)
MCV: 92.8 fL (ref 79.5–101.0)
MONO#: 0.6 10*3/uL (ref 0.1–0.9)
MONO%: 13.6 % (ref 0.0–14.0)
NEUT%: 57 % (ref 38.4–76.8)
NEUTROS ABS: 2.4 10*3/uL (ref 1.5–6.5)
PLATELETS: 432 10*3/uL — AB (ref 145–400)
RBC: 4.28 10*6/uL (ref 3.70–5.45)
RDW: 12.9 % (ref 11.2–14.5)
WBC: 4.3 10*3/uL (ref 3.9–10.3)

## 2016-12-07 LAB — IRON AND TIBC
%SAT: 15 % — AB (ref 21–57)
IRON: 55 ug/dL (ref 41–142)
TIBC: 373 ug/dL (ref 236–444)
UIBC: 318 ug/dL (ref 120–384)

## 2016-12-07 LAB — FERRITIN: FERRITIN: 34 ng/mL (ref 9–269)

## 2016-12-08 ENCOUNTER — Ambulatory Visit (HOSPITAL_BASED_OUTPATIENT_CLINIC_OR_DEPARTMENT_OTHER): Payer: Managed Care, Other (non HMO) | Admitting: Hematology and Oncology

## 2016-12-08 ENCOUNTER — Ambulatory Visit: Payer: Managed Care, Other (non HMO)

## 2016-12-08 DIAGNOSIS — D500 Iron deficiency anemia secondary to blood loss (chronic): Secondary | ICD-10-CM | POA: Diagnosis not present

## 2016-12-08 NOTE — Progress Notes (Signed)
Patient Care Team: Schoenhoff, Altamese Cabal, MD as PCP - General (Internal Medicine)  DIAGNOSIS:  Encounter Diagnosis  Name Primary?  . Iron deficiency anemia due to chronic blood loss     CHIEF COMPLIANT: Follow-up of iron deficiency anemia  INTERVAL HISTORY: Felicia Ortega is a 41 year old with above-mentioned history of iron deficiency anemia who received IV iron therapy 6 months ago and is here for a blood count recheck.  Patient only received 1 dose of IV iron instead of 2 doses.  She appears to have significantly improved her symptoms.  She also gets B12 injections with her primary care physician.  She had hysterectomy and since then she no longer has heavy menstrual bleeding, which was the cause of her iron deficiency anemia.  She wishes she had done the hysterectomy a while ago.  REVIEW OF SYSTEMS:   Constitutional: Denies fevers, chills or abnormal weight loss Eyes: Denies blurriness of vision Ears, nose, mouth, throat, and face: Denies mucositis or sore throat Respiratory: Denies cough, dyspnea or wheezes Cardiovascular: Denies palpitation, chest discomfort Gastrointestinal:  Denies nausea, heartburn or change in bowel habits Skin: Denies abnormal skin rashes Lymphatics: Denies new lymphadenopathy or easy bruising Neurological:Denies numbness, tingling or new weaknesses Behavioral/Psych: Mood is stable, no new changes  Extremities: No lower extremity edema  All other systems were reviewed with the patient and are negative.  I have reviewed the past medical history, past surgical history, social history and family history with the patient and they are unchanged from previous note.  ALLERGIES:  is allergic to sulfa antibiotics and epinephrine.  MEDICATIONS:  Current Outpatient Medications  Medication Sig Dispense Refill  . oxyCODONE-acetaminophen (PERCOCET/ROXICET) 5-325 MG tablet Take 1-2 tablets by mouth every 4 (four) hours as needed for severe pain (moderate to  severe pain (when tolerating fluids)). 30 tablet 0  . spironolactone (ALDACTONE) 100 MG tablet Take 100 mg by mouth every morning.    . venlafaxine XR (EFFEXOR-XR) 150 MG 24 hr capsule Take 150 mg by mouth at bedtime.     No current facility-administered medications for this visit.     PHYSICAL EXAMINATION: ECOG PERFORMANCE STATUS: 1 - Symptomatic but completely ambulatory  Vitals:   12/08/16 1147  BP: 124/71  Pulse: 79  Resp: 20  Temp: 98.1 F (36.7 C)  SpO2: 98%   Filed Weights   12/08/16 1147  Weight: 126 lb 4.8 oz (57.3 kg)    GENERAL:alert, no distress and comfortable SKIN: skin color, texture, turgor are normal, no rashes or significant lesions EYES: normal, Conjunctiva are pink and non-injected, sclera clear OROPHARYNX:no exudate, no erythema and lips, buccal mucosa, and tongue normal  NECK: supple, thyroid normal size, non-tender, without nodularity LYMPH:  no palpable lymphadenopathy in the cervical, axillary or inguinal LUNGS: clear to auscultation and percussion with normal breathing effort HEART: regular rate & rhythm and no murmurs and no lower extremity edema ABDOMEN:abdomen soft, non-tender and normal bowel sounds MUSCULOSKELETAL:no cyanosis of digits and no clubbing  NEURO: alert & oriented x 3 with fluent speech, no focal motor/sensory deficits EXTREMITIES: No lower extremity edema   LABORATORY DATA:  I have reviewed the data as listed   Chemistry      Component Value Date/Time   NA 135 09/05/2016 1515   K 4.1 09/05/2016 1515   CL 101 09/05/2016 1515   CO2 24 09/05/2016 1515   BUN 14 09/05/2016 1515   CREATININE 0.89 09/05/2016 1515      Component Value Date/Time   CALCIUM 9.5  09/05/2016 1515   ALKPHOS 44 01/16/2014 1044   AST 17 01/16/2014 1044   ALT 9 01/16/2014 1044   BILITOT 0.3 01/16/2014 1044       Lab Results  Component Value Date   WBC 4.3 12/07/2016   HGB 13.3 12/07/2016   HCT 39.7 12/07/2016   MCV 92.8 12/07/2016   PLT 432  (H) 12/07/2016   NEUTROABS 2.4 12/07/2016    ASSESSMENT & PLAN:  Iron deficiency anemia due to chronic blood loss Profound iron deficiency anemia with iron saturation of 3% hemoglobin of 9.6 with an MCV of 71. Patient was intolerant to oral iron therapy because it causes severe abdominal pain and constipation.  Treatment: Feraheme 10/22/15, 06/01/16 Lab review:Hemoglobin 13.3, iron saturation 15%, ferritin 34 (improved from 5 done 6 months ago) There is no indication for IV iron infusion.  Patient had hysterectomy and hence she does not have any further problems with heavy menstrual bleeding. I am hoping that she will not need any iron infusion in the future. Return to clinic on as-needed basis.  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 Current treatment: B 12 injections monthly (with her primary care physician). Patient does not have pernicious anemia.  Return to clinic on an as-needed basis.   I spent 25 minutes talking to the patient of which more than half was spent in counseling and coordination of care.  No orders of the defined types were placed in this encounter.  The patient has a good understanding of the overall plan. she agrees with it. she will call with any problems that may develop before the next visit here.   Rulon Eisenmenger, MD 12/08/16

## 2016-12-08 NOTE — Assessment & Plan Note (Signed)
Profound iron deficiency anemia with iron saturation of 3% hemoglobin of 9.6 with an MCV of 71. After iron replacement her hemoglobin was 12.2, iron saturation 23244 % and ferritin Patient was intolerant to oral iron therapy because it causes severe abdominal pain and constipation.  Treatment: Feraheme 10/22/15, 06/01/16 Lab review:Hemoglobin 13.3, iron saturation 15%, ferritin 34 (improved from 5 done 6 months ago) There is no indication for IV iron infusion.  Cause of iron deficiency is the heavy GYN bleeding from her monthly periods.  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 Current treatment: B 12 injections monthly (with her primary care physician). Patient does not have pernicious anemia.  Return to clinic in 6 months with labs done ahead of time and follow-up

## 2018-07-16 ENCOUNTER — Other Ambulatory Visit: Payer: Self-pay | Admitting: Obstetrics and Gynecology

## 2018-07-16 DIAGNOSIS — R5381 Other malaise: Secondary | ICD-10-CM

## 2018-07-18 ENCOUNTER — Other Ambulatory Visit: Payer: Self-pay | Admitting: Obstetrics and Gynecology

## 2018-07-18 DIAGNOSIS — R928 Other abnormal and inconclusive findings on diagnostic imaging of breast: Secondary | ICD-10-CM

## 2018-07-29 ENCOUNTER — Other Ambulatory Visit: Payer: Self-pay | Admitting: Obstetrics and Gynecology

## 2018-07-29 DIAGNOSIS — N6310 Unspecified lump in the right breast, unspecified quadrant: Secondary | ICD-10-CM

## 2018-07-30 ENCOUNTER — Other Ambulatory Visit: Payer: Self-pay | Admitting: Obstetrics and Gynecology

## 2018-07-30 DIAGNOSIS — N6310 Unspecified lump in the right breast, unspecified quadrant: Secondary | ICD-10-CM

## 2018-08-14 ENCOUNTER — Other Ambulatory Visit: Payer: Self-pay | Admitting: Obstetrics and Gynecology

## 2018-08-14 ENCOUNTER — Ambulatory Visit
Admission: RE | Admit: 2018-08-14 | Discharge: 2018-08-14 | Disposition: A | Discharge status: Home / Self Care | Payer: Managed Care, Other (non HMO) | Source: Ambulatory Visit | Attending: Obstetrics and Gynecology | Admitting: Obstetrics and Gynecology

## 2018-08-14 ENCOUNTER — Other Ambulatory Visit: Payer: Self-pay

## 2018-08-14 DIAGNOSIS — N6310 Unspecified lump in the right breast, unspecified quadrant: Secondary | ICD-10-CM

## 2019-09-05 ENCOUNTER — Other Ambulatory Visit: Payer: Self-pay | Admitting: Obstetrics and Gynecology

## 2019-09-05 DIAGNOSIS — R928 Other abnormal and inconclusive findings on diagnostic imaging of breast: Secondary | ICD-10-CM

## 2019-09-10 ENCOUNTER — Ambulatory Visit
Admission: RE | Admit: 2019-09-10 | Discharge: 2019-09-10 | Disposition: A | Discharge status: Home / Self Care | Payer: Managed Care, Other (non HMO) | Source: Ambulatory Visit | Attending: Obstetrics and Gynecology | Admitting: Obstetrics and Gynecology

## 2019-09-10 ENCOUNTER — Other Ambulatory Visit: Payer: Self-pay

## 2019-09-10 DIAGNOSIS — R928 Other abnormal and inconclusive findings on diagnostic imaging of breast: Secondary | ICD-10-CM

## 2019-09-10 HISTORY — PX: BREAST BIOPSY: SHX20

## 2020-08-17 ENCOUNTER — Other Ambulatory Visit: Payer: Self-pay | Admitting: Obstetrics and Gynecology

## 2020-08-17 DIAGNOSIS — Z1231 Encounter for screening mammogram for malignant neoplasm of breast: Secondary | ICD-10-CM

## 2020-09-17 ENCOUNTER — Other Ambulatory Visit: Payer: Self-pay | Admitting: Obstetrics and Gynecology

## 2020-09-17 DIAGNOSIS — Z1231 Encounter for screening mammogram for malignant neoplasm of breast: Secondary | ICD-10-CM

## 2020-09-24 ENCOUNTER — Other Ambulatory Visit: Payer: Self-pay

## 2020-09-24 ENCOUNTER — Ambulatory Visit
Admission: RE | Admit: 2020-09-24 | Discharge: 2020-09-24 | Disposition: A | Discharge status: Home / Self Care | Payer: No Typology Code available for payment source | Source: Ambulatory Visit | Attending: Obstetrics and Gynecology | Admitting: Obstetrics and Gynecology

## 2020-09-24 ENCOUNTER — Encounter: Payer: Self-pay | Admitting: Hematology and Oncology

## 2020-09-24 DIAGNOSIS — Z1231 Encounter for screening mammogram for malignant neoplasm of breast: Secondary | ICD-10-CM

## 2022-12-02 IMAGING — MG MM DIGITAL SCREENING BILAT W/ TOMO AND CAD
8 series · 9 of 24 positions shown · non-contrast
Comparison: Previous exam(s).

CLINICAL DATA: Screening.

EXAM:
DIGITAL SCREENING BILATERAL MAMMOGRAM WITH TOMOSYNTHESIS AND CAD
TECHNIQUE: Bilateral screening digital craniocaudal and mediolateral oblique
mammograms were obtained. Bilateral screening digital breast
tomosynthesis was performed. The images were evaluated with
computer-aided detection.

[L MLO synth-2D]
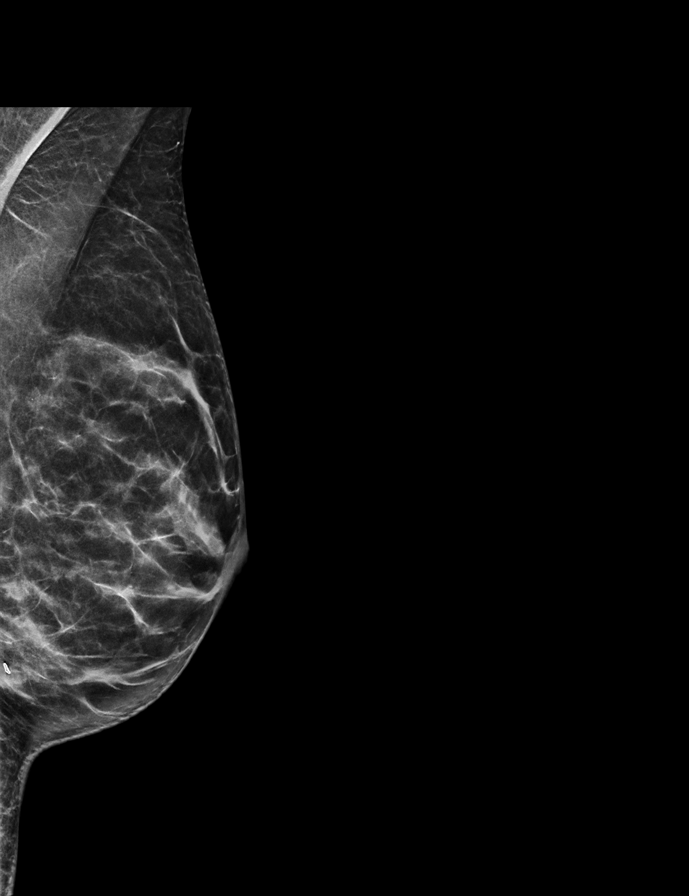

[R CC synth-2D]
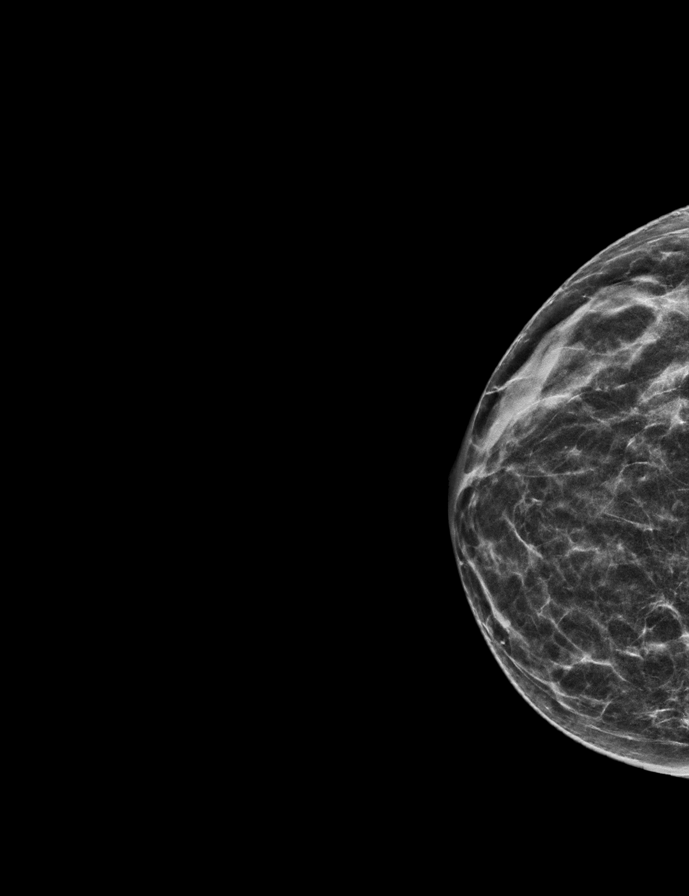

[R MLO synth-2D]
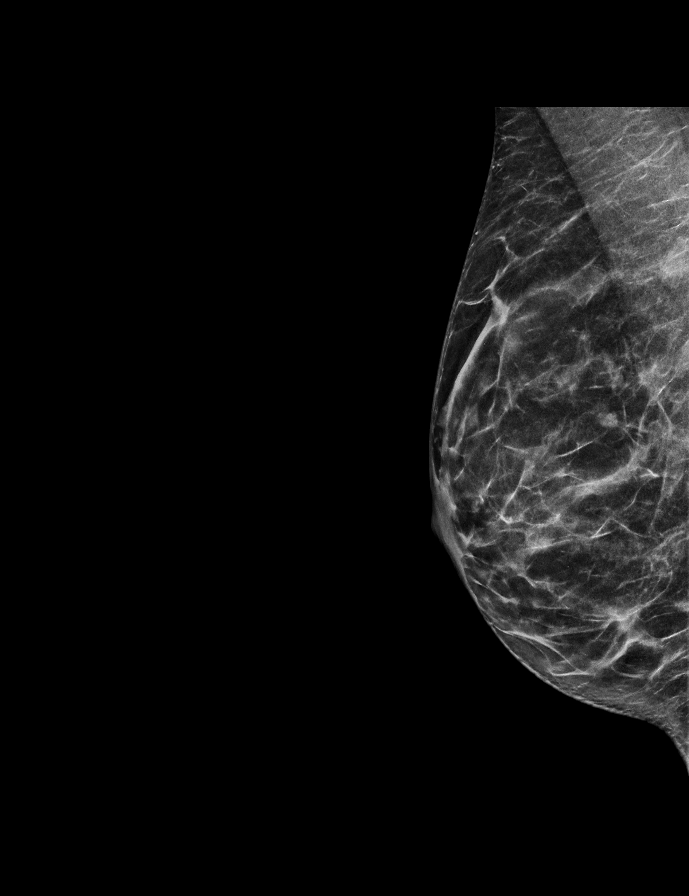

[L CC synth-2D]
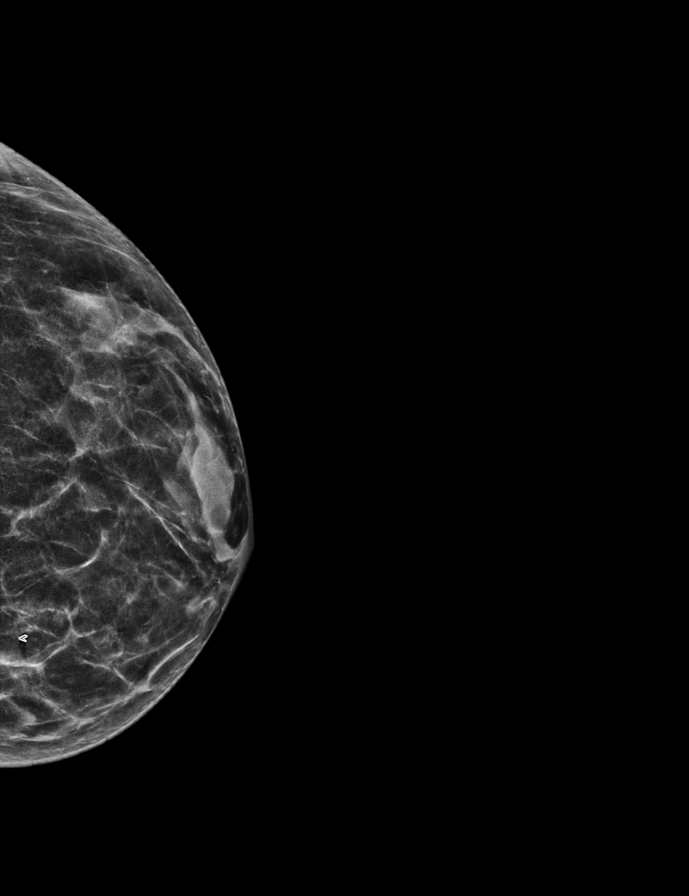

[R CC tomo · 2 of 44 frames shown]
[frame 15/44]
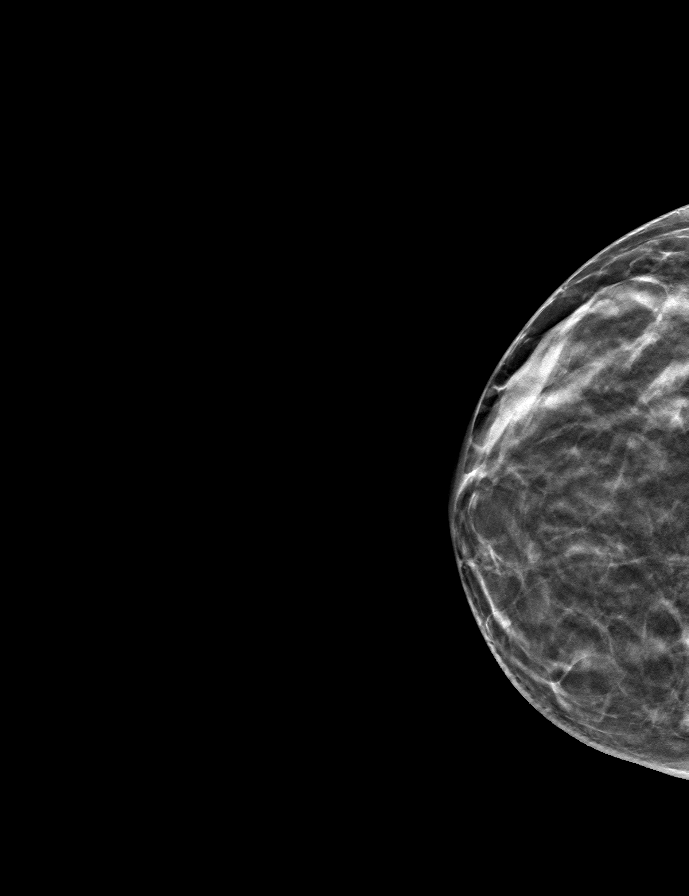
[frame 23/44]
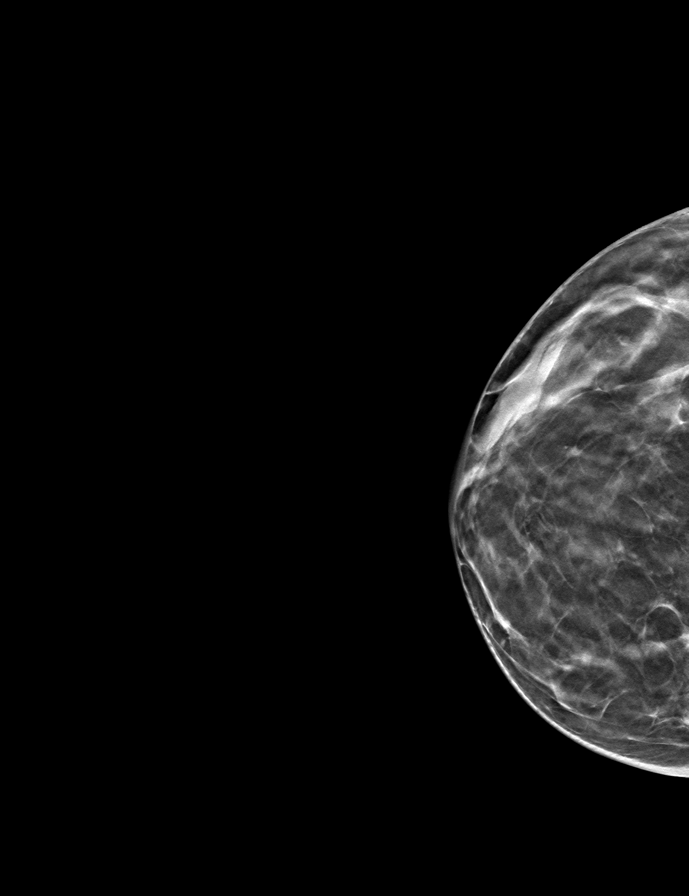

[L MLO tomo · tomo slice 27/53.0]
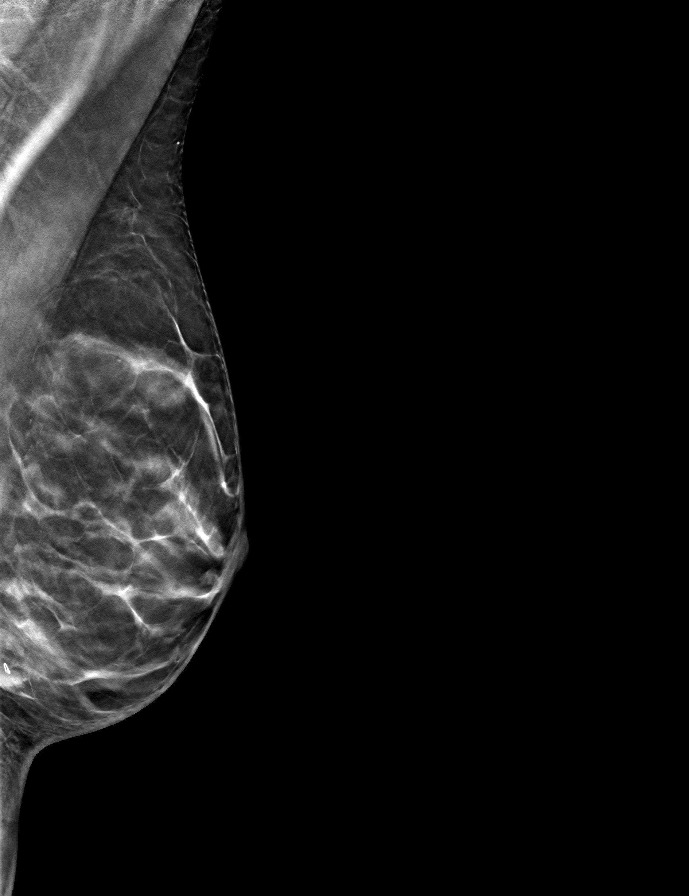

[R MLO tomo · tomo slice 25/48.0]
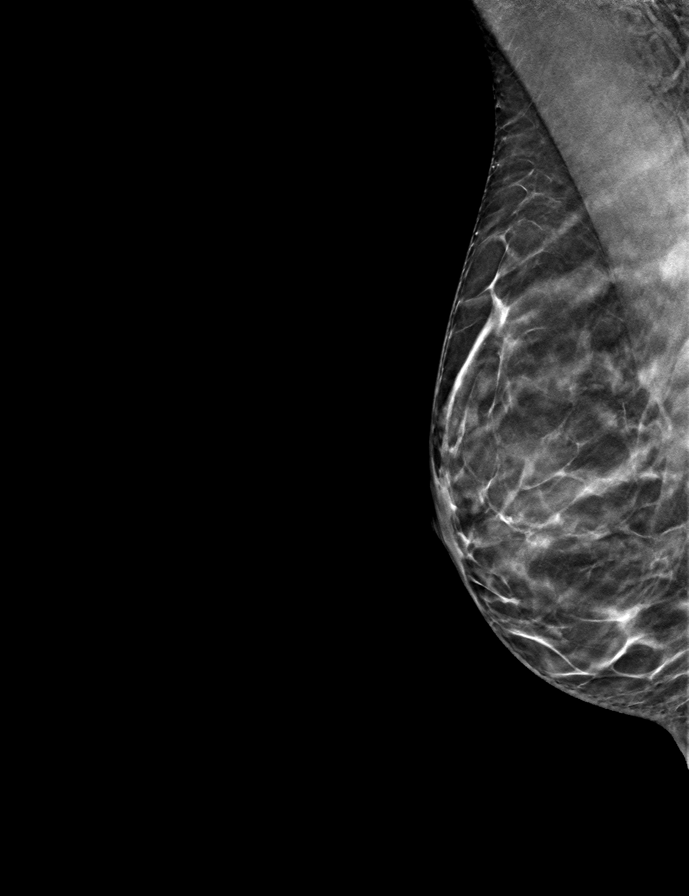

[L CC tomo · tomo slice 23/46.0]
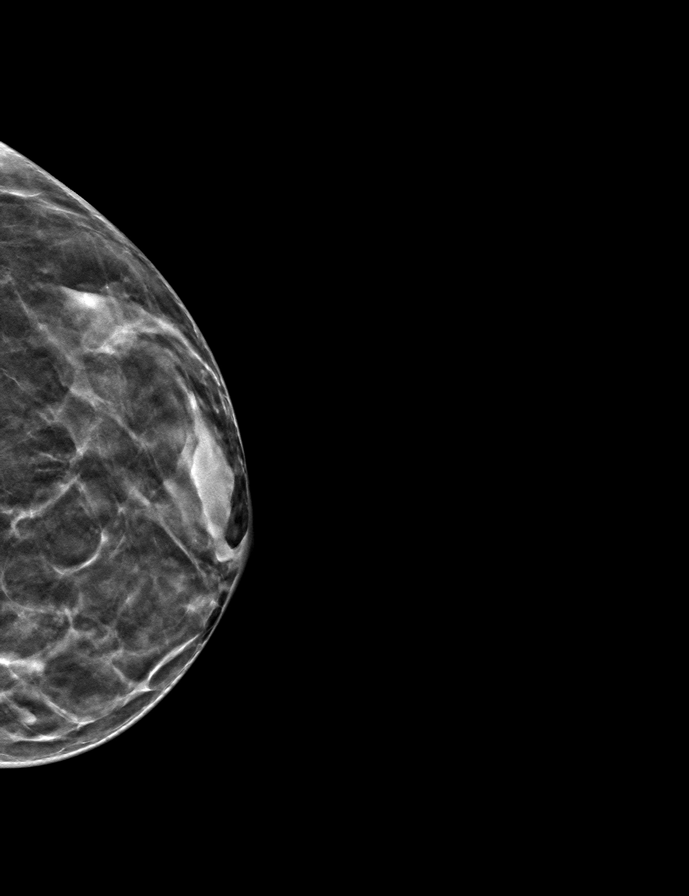

[9 of 24 positions shown; findings below may reference images not displayed]

ACR Breast Density Category c: The breast tissue is heterogeneously
dense, which may obscure small masses.
FINDINGS: There are no findings suspicious for malignancy.
IMPRESSION: No mammographic evidence of malignancy. A result letter of this
screening mammogram will be mailed directly to the patient.

RECOMMENDATION:
Screening mammogram in one year. (Code:Q3-W-BC3)

BI-RADS CATEGORY  1: Negative.
